# Patient Record
Sex: Male | Born: 1945 | Race: White | Hispanic: No | Marital: Married | Smoking: Former smoker
Health system: Southern US, Community
[De-identification: ages and names within clinical notes are randomized; demographics above are authoritative.]

## PROBLEM LIST (undated history)

## (undated) DIAGNOSIS — G4733 Obstructive sleep apnea (adult) (pediatric): Secondary | ICD-10-CM

## (undated) DIAGNOSIS — I495 Sick sinus syndrome: Secondary | ICD-10-CM

## (undated) DIAGNOSIS — I509 Heart failure, unspecified: Secondary | ICD-10-CM

## (undated) DIAGNOSIS — Z9289 Personal history of other medical treatment: Secondary | ICD-10-CM

## (undated) DIAGNOSIS — Z950 Presence of cardiac pacemaker: Secondary | ICD-10-CM

## (undated) DIAGNOSIS — I499 Cardiac arrhythmia, unspecified: Secondary | ICD-10-CM

## (undated) DIAGNOSIS — Z95810 Presence of automatic (implantable) cardiac defibrillator: Secondary | ICD-10-CM

## (undated) DIAGNOSIS — E785 Hyperlipidemia, unspecified: Secondary | ICD-10-CM

## (undated) DIAGNOSIS — I4891 Unspecified atrial fibrillation: Secondary | ICD-10-CM

## (undated) DIAGNOSIS — M25569 Pain in unspecified knee: Secondary | ICD-10-CM

## (undated) DIAGNOSIS — I2510 Atherosclerotic heart disease of native coronary artery without angina pectoris: Secondary | ICD-10-CM

## (undated) DIAGNOSIS — M549 Dorsalgia, unspecified: Secondary | ICD-10-CM

## (undated) DIAGNOSIS — M5126 Other intervertebral disc displacement, lumbar region: Secondary | ICD-10-CM

## (undated) DIAGNOSIS — I10 Essential (primary) hypertension: Secondary | ICD-10-CM

## (undated) DIAGNOSIS — N400 Enlarged prostate without lower urinary tract symptoms: Secondary | ICD-10-CM

## (undated) HISTORY — DX: Other intervertebral disc displacement, lumbar region: M51.26

## (undated) HISTORY — DX: Pain in unspecified knee: M25.569

## (undated) HISTORY — PX: COLONOSCOPY: SHX174

## (undated) HISTORY — DX: Sick sinus syndrome: I49.5

## (undated) HISTORY — DX: Benign prostatic hyperplasia without lower urinary tract symptoms: N40.0

## (undated) HISTORY — DX: Atherosclerotic heart disease of native coronary artery without angina pectoris: I25.10

## (undated) HISTORY — PX: PACEMAKER IMPLANT: EP1218

## (undated) HISTORY — PX: KNEE SURGERY: SHX244

## (undated) HISTORY — PX: NOSE SURGERY: SHX723

## (undated) HISTORY — DX: Obstructive sleep apnea (adult) (pediatric): G47.33

## (undated) HISTORY — PX: CARDIAC DEFIBRILLATOR PLACEMENT: SHX171

## (undated) HISTORY — DX: Cardiac arrhythmia, unspecified: I49.9

## (undated) HISTORY — DX: Essential (primary) hypertension: I10

## (undated) HISTORY — DX: Heart failure, unspecified: I50.9

## (undated) HISTORY — DX: Personal history of other medical treatment: Z92.89

## (undated) HISTORY — DX: Hyperlipidemia, unspecified: E78.5

## (undated) HISTORY — DX: Unspecified atrial fibrillation: I48.91

## (undated) HISTORY — DX: Dorsalgia, unspecified: M54.9

---

## 1979-08-13 HISTORY — PX: BACK SURGERY: SHX140

## 2006-09-29 ENCOUNTER — Ambulatory Visit: Payer: Self-pay | Admitting: Gastroenterology

## 2007-03-21 ENCOUNTER — Emergency Department (HOSPITAL_COMMUNITY): Admission: EM | Admit: 2007-03-21 | Discharge: 2007-03-21 | Payer: Self-pay | Admitting: Emergency Medicine

## 2007-08-13 HISTORY — PX: ATRIAL FIBRILLATION ABLATION: SHX5732

## 2008-03-21 ENCOUNTER — Encounter: Payer: Self-pay | Admitting: Emergency Medicine

## 2008-03-22 ENCOUNTER — Inpatient Hospital Stay (HOSPITAL_COMMUNITY): Admission: EM | Admit: 2008-03-22 | Discharge: 2008-03-23 | Payer: Self-pay | Admitting: Cardiovascular Disease

## 2008-10-19 ENCOUNTER — Ambulatory Visit (HOSPITAL_COMMUNITY): Admission: RE | Admit: 2008-10-19 | Discharge: 2008-10-20 | Payer: Self-pay | Admitting: Otolaryngology

## 2009-06-27 ENCOUNTER — Encounter: Admission: RE | Admit: 2009-06-27 | Discharge: 2009-06-27 | Payer: Self-pay | Admitting: Orthopedic Surgery

## 2010-09-03 ENCOUNTER — Encounter
Admission: RE | Admit: 2010-09-03 | Discharge: 2010-09-03 | Payer: Self-pay | Source: Home / Self Care | Attending: Otolaryngology | Admitting: Otolaryngology

## 2010-11-22 LAB — BASIC METABOLIC PANEL
Calcium: 9.7 mg/dL (ref 8.4–10.5)
GFR calc Af Amer: >60 mL/min (ref >60)
GFR calc non Af Amer: >60 mL/min (ref >60)
Sodium: 140 mEq/L (ref 135–145)

## 2010-11-22 LAB — CBC
Hemoglobin: 14.7 g/dL (ref 13.0–17.0)
RBC: 4.5 MIL/uL (ref 4.22–5.81)
WBC: 5.4 10*3/uL (ref 4.0–10.5)

## 2010-12-25 NOTE — Op Note (Signed)
NAME:  Edward [name], GOLSON NO.:  [id number]   MEDICAL RECORD NO.:  [id number]          PATIENT TYPE:  OIB   LOCATION:  3308                         FACILITY:  [facility]   PHYSICIAN:  Kinnie Scales. Annalee Genta, M.D.DATE OF BIRTH:  1946-01-11   DATE OF PROCEDURE:  10/19/2008  DATE OF DISCHARGE:                               OPERATIVE REPORT   PREOPERATIVE DIAGNOSES:  1. Deviated nasal septum with airway obstruction.  2. Inferior turbinate hypertrophy.  3. Obstructive sleep apnea.  4. Status post prior nasal septoplasty.   POSTOPERATIVE DIAGNOSES:  1. Deviated nasal septum with airway obstruction.  2. Inferior turbinate hypertrophy.  3. Obstructive sleep apnea.  4. Status post prior nasal septoplasty.   INDICATIONS FOR SURGERY:  1. Deviated nasal septum with airway obstruction.  2. Inferior turbinate hypertrophy.  3. Obstructive sleep apnea.  4. Status post prior nasal septoplasty.   SURGICAL PROCEDURES:  1. Revision of nasal septoplasty.  2. Bilateral inferior turbinate reduction.   ANESTHESIA:  General endotracheal.   SURGEON:  Kinnie Scales. Annalee Genta, MD   COMPLICATIONS:  None.   BLOOD LOSS:  Less than 50 mL.   The patient transferred from the operating room to recovery room in  stable condition.  There were no complications.   BRIEF HISTORY:  The patient is a 65 year old white male who is referred  for evaluation of symptomatic nasal airway obstruction.  The patient has  a history of moderately severe obstructive sleep apnea.  He is unable to  wear CPAP because of nasal congestion and obstruction.  He had undergone  prior nasal septoplasty decades ago and since that time has noted  increasing problems with nasal airway blockage.  Examination revealed  significant amount of intranasal scarring and a right septal deviation  with inferior turbinate hypertrophy.  Given the patient's history and  physical examination, I recommended to undertake revision of nasal  septoplasty and inferior turbinate reduction.  The patient has  significant cardiac history including cardiac arrhythmia and underwent  ablation procedures performed at [facility].  He  was cleared by his cardiologist prior to any surgical intervention.  The  risks, benefits, and possible complications of the above procedure were  discussed in detail.  The patient understood and concurred with our plan  for surgery.  We anticipated overnight observation in a Step-Down Unit  because of the patient's underlying sleep apnea.   PROCEDURE:  The patient was brought to the operating room on October 19, 2008 and placed in supine position on the operating table.  General  endotracheal anesthesia was established without difficulty.  When the  patient was adequately anesthetized, his nasal cavities were examined  and then injected with a total of 5 mL of 1% lidocaine with 1:100,000  solution of epinephrine which was injected in a submucosal fashion along  the nasal septum and along the inferior turbinates bilaterally.  The  patient's nose was then packed with Afrin-soaked cottonoid pledget,  which was left in place for approximately 10 minutes for allowing  vasoconstriction and hemostasis.  The patient was prepped and draped in  a sterile  fashion, positioned on the operating table, and surgical  procedure was begun.   A right anterior hemitransfixion incision was created and a  mucoperichondrial flap was elevated from anterior to posterior along the  right hand side.  The patient had significant intranasal scarring and a  large right inferior septal spur in the mid and posterior aspects of  nasal septum.  Cartilage and bone had been resected in his previous  operation and this was relatively midline.  The anterior one-third of  the nasal septum was then fully exposed and scar tissue was resected and  a small amount of the mid septal cartilage.  Osteotome was used to  mobilize  bony septal spur and the overlying mucosa was preserved.  The  bone spur was resected.  The anterior dorsal and columellar cartilage  was intact and a midline position was not implemented during the  surgical procedure.  The mucoperichondrial flaps were then  reapproximated with a 4-0 gut suture on a Keith needle in a horizontal  mattress fashion.  The anterior hemitransfixion incision was closed in  the same stitch.  At the conclusion of surgical procedure, bilateral  Doyle nasal septal splints were placed after the application of  Bactroban ointment and these were sutured into position with a 3-0  Ethilon suture.   Attention was then turned to the inferior turbinates where turbinate  reduction was undertaken, beginning with bipolar cautery set at 12  watts.  Two submucosal passes were made in each inferior turbinate.  When the turbinates had been adequately cauterized, they were  outfractured.  Small anterior incisions were created in each turbinate  and small amount of turbinate bone was resected preserving the overlying  mucosa.  This created a widely patent anterior nasal cavity without  obstruction.  Nasal cavity was irrigated and suctioned.  Orogastric tube  was passed.  Stomach contents were aspirated.  The patient was then  awakened from the anesthetic, extubated, and transferred from the  operating room to recovery room in stable condition.  There were no  complications and blood loss was less than 50 mL.           ______________________________  Kinnie Scales. Annalee Genta, M.D.     DLS/MEDQ  D:  95/62/1308  T:  10/19/2008  Job:  [id number]

## 2010-12-25 NOTE — H&P (Signed)
NAME:  Edward [name], MCENROE NO.:  [id number]   MEDICAL RECORD NO.:  [id number]          PATIENT TYPE:  EMS   LOCATION:  ED                           [facility]ACILITY[facility]  [facility]   PHYSICIAN:  Richard A. Alanda Amass, M.D.DATE OF BIRTH:  [birth year]   DATE OF ADMISSION:  03/21/2008  DATE OF DISCHARGE:                              HISTORY & PHYSICAL   CHIEF COMPLAINTS:  Passing out.   HISTORY OF THE PRESENT ILLNESS:  The patient is a 65 year old male who  has been followed by Dr. Alanda Amass.  He is a Dentist.  He was seen by Dr. Alanda Amass for a similar spell a year ago.  He had an echocardiogram and a stress Myoview according to the patient,  which were normal.  A monitor did document PAF.  He apparently has  intermittent PAF and the patient usually notices this at night.  He does  not have palpitations or dizziness, but he can feel when his pulse is  irregular and he feels like he has to urinate frequently at night when  he is in atrial fibrillation.  His medications have been adjusted  recently for hypertension.  He was on Norvasc and this was discontinued,  and he was put on half of a Maxzide.  He apparently had some edema with  Norvasc.  He takes his Avapro at 7:00 P.M. each day and as a diuretic in  the morning.  This evening he took his Avapro went for a walk.  About 2  miles into his usual 3-3.5-mile  walk he could feel that his heart rate  was irregular.  He then had a frank syncopal spell.  He did sustain a  laceration to his nose and his chin, and his knees.  He is now seen in  the emergency room.   When he initially presented emergency room he was in atrial fibrillation  with interventricular conduction delay and rapid ventricular response of  143.  He was given Cardizem in the emergency room and he is now in sinus  rhythm at 75.  The patient denies any chest pain.   PAST MEDICAL AND SURGICAL HISTORY:  The patient's past medical history  is  remarkable for treated hypertension.  He has dyslipidemia.  He has  been seen at [facility] for gastroesophageal reflux symptoms.  He has  had previous back surgery and arthroscopic knee surgery.   MEDICATIONS:  The patient's current medications are:  1. Lipitor 40 mg a day.  2. Potassium 10 mEq a day.  3. Avapro 300 mg a day.  4. Aciphex 20 mg a day.  5. Maxzide 1/2 every day.   ALLERGIES:  The patient has no known drug allergies.   SOCIAL HISTORY:  The patient is a nonsmoker.  He is married.  He has one  child.  As noted he is the [employer].   FAMILY HISTORY:  The patient is adopted.   REVIEW OF SYSTEMS:  The review of systems is essentially unremarkable,  except for as noted above.  He denies any prostate trouble.  He has  not  had recent fever or chills, or nausea and vomiting.  He has not had  chest pain or unusual dyspnea.  He has not had thyroid problems or  diabetes.   PHYSICAL EXAMINATION:  VITAL SIGNS:  On physical exam in the emergency  room his blood pressure 134/82, his pulse now is 75 and respirations are  16.  GENERAL APPEARANCE:  In general he is a well-developed and well-  nourished male in no acute distress.  HEENT:  Head is essentially normocephalic.  He does have a laceration on  his nose and an abrasion on his chin.  NECK:  The neck is without JVD or bruit.  Thyroid is not enlarged.  CHEST:  The chest is clear to auscultation and percussion.  HEART:  Cardiac exam reveals regular rate and rhythm without obvious  murmur, rub or gallop.  Normal S1 and S2.  ABDOMEN:  The abdomen is nontender.  No hepatosplenomegaly.  Bowel  sounds are present.  EXTREMITIES:  The extremities are without edema.  He has a 3+/4  posterior tibial pulses bilaterally.  NEUROLOGIC:  The neuro exam is grossly intact.  He is awake, alert,  oriented, cooperative, and moves all extremities without obvious  deficit.   LABORATORY DATA:  Labs; CT scan of his head and  neck is negative.  Chemistries show sodium 143, potassium 3.3, BUN 18 and creatinine 1.12.  White count 5.5, hemoglobin 14.1, hematocrit 41.8 and platelets 247,000.  His SGOT is slightly elevated at 45 and SGPT 37.   IMPRESSION:  1. Syncopal spell, possibly orthostatic or possibly paroxysmal atrial      fibrillation with conversion pauses.  2. History of paroxysmal atrial fibrillation.  3. History of treated hypertension.  4. Treated dyslipidemia.  5. Gastroesophageal reflux.  6. Previous normal echocardiogram and stress Myoview per the patient's      history.   PLAN:  1. The patient will be admitted to the CCU.  2. We will have to transfer him to [facility] when a bed is available in      case he needs a temporary pacemaker.  3. We will arrange for an external pacemaker to be outside the room.  4. We will give him extra potassium tonight.  5. For now we will hold his diuretic.  6. His initial enzymes are negative and we will get another set in the      morning.  The patient does state that he has an appointment Dr.      Sampson Goon at [facility] next month.      Edward [name], P.A.      Richard A. Alanda Amass, M.D.  Electronically Signed    LKK/MEDQ  D:  03/21/2008  T:  03/22/2008  Job:  [id number]

## 2010-12-25 NOTE — Discharge Summary (Signed)
NAME:  Edward [name], Edward [name] NO.:  [id number]   MEDICAL RECORD NO.:  [id number]          PATIENT TYPE:  INP   LOCATION:  2001                         FACILITY:  [facility]   PHYSICIAN:  Richard A. Alanda Amass, M.D.DATE OF BIRTH:  [birth year]   DATE OF ADMISSION:  03/22/2008  DATE OF DISCHARGE:  03/23/2008                               DISCHARGE SUMMARY   DISCHARGE DIAGNOSES:  1. Status post syncope.  2. Atrial fibrillation/flutter with rapid ventricular response on      admission.  3. Known history of paroxysmal atrial fibrillation/flutter with 2:1      conduction delay.  4. Hypertension.  5. Hyperlipidemia.  6. Adult-onset diabetes mellitus.  7. Gastroesophageal reflux disease.   This is a 65 year old gentleman who is followed up by Dr. Alanda Amass in  our office for paroxysmal atrial fibrillation and the patient was  wearing CardioNet to identify the underlying arrhythmia.  The recording  revealed AFib/flutter with ventricular conduction delay and rate was as  high as 180.  The patient was scheduled to see Dr. Sampson Goon at [facility] to address this arrhythmia and he has an upcoming  appointment on April 12, 2008.  He has negative history of nuclear  stress test and echocardiogram and intermittently experiences this  bursts of rapid AFib.  The patient went for a walk and while walking  felt some strange sensation in the heart and deliberately fell having  syncopal spell.  During the fall, he injured the face and was brought to  the hospital with multiple facial abrasions.  On admission, telemetry  revealed AFib with rapid ventricular response and the patient was  started on Cardizem drip.  After the rate was controlled and the patient  converted to normal sinus rhythm, he was switched to p.o. Cardizem 30 mg  q.6 h.  We kept the patient in the hospital for observation 24 hours and  he did not have any recurrent arrhythmia.  He ambulated around the ward  without difficulties.  There were no recurrent syncopal spells.  On  March 23, 2008, the patient was seen by Dr. Allyson Sabal who considered him to  be stable for discharge home.   HOSPITAL LABORATORIES:  BMET showed sodium 138, potassium 3.5, chloride  104, CO2 26, BUN 11, creatinine 0.9, and glucose 117.  White blood cell  count 7.8, hemoglobin 14.3, hematocrit 41.9, and platelet count 255.   DISCHARGE MEDICATIONS:  The patient was discharged home on the following  meds.  1. Aciphex 20 mg daily.  2. Avapro 300 mg daily.  3. Potassium chloride 10 mEq daily.  4. Lipitor 20 mg daily.  5. Triamterene/hydrochlorothiazide 75/50 daily.  6. Cardizem 120 mg daily.  7. Neosporin facial ointment which he was instructed to apply twice a      day to facial abrasions.   DISCHARGE FOLLOWUP:  The patient was instructed to keep his appointment  at [facility] on April 12, 2008 with Dr. Sampson Goon and  then he will be seen by Dr. Alanda Amass after that on April 15, 2008 at  2:30 p.m. in our office.  Raymon Mutton, P.A.      Richard A. Alanda Amass, M.D.  Electronically Signed    MK/MEDQ  D:  03/23/2008  T:  03/24/2008  Job:  [id number]   cc:   [facility]  [name]. Dagoberto Ligas, M.D.

## 2010-12-28 NOTE — Assessment & Plan Note (Signed)
[name]                         GASTROENTEROLOGY OFFICE NOTE   Edward [name], Edward [name]                     MRN:          [id number]  DATE:09/29/2006                            DOB:          18-Oct-1945    He returns for his yearly followup for his acid reflux. He takes AcipHex  20 mg a day and is asymptomatic. He has no dysphagia, chest pain or  extra esophageal manifestations of GERD. His bowels are moving well and  he is up-to-date on his colonoscopy with his last exam February 2002.   He is followed every 6 months by Dr. Dagoberto Ligas for hypertension and  hypercholesterolemia and is on Lipitor 20 mg a day, Avapro 300 mg a day,  triamterene-HCTZ 75-50 mg a day. He says he does yearly Hemoccult cards  which have been negative. There is no family history of colon carcinoma  but he is adopted.   He weighs 280 pounds, blood pressure 150/90, pulse was 72 and regular. A  general physical examination was not performed.   ASSESSMENT:  Edward [name] has chronic gastroesophageal reflux disease  which is managed well with daily proton pump inhibitor therapy. His last  endoscopy was in June of 1998.   RECOMMENDATIONS:  1. Colonoscopy at 10 year intervals.  2. Renew AcipHex and reflux regimen and review with patient.  3. Continue medication and follow up with Dr. Dagoberto Ligas as previously      planned.     [name], MD, Caleen Essex, FAGA  Electronically Signed    DRP/MedQ  DD: 09/29/2006  DT: 09/29/2006  Job #: [id number]   cc:   [name]. Dagoberto Ligas, M.D.

## 2011-05-10 LAB — DIFFERENTIAL
Eosinophils Absolute: 0.1
Lymphs Abs: 1.2
Neutrophils Relative %: 65

## 2011-05-10 LAB — CBC
HCT: 41.8
Hemoglobin: 14.1
MCHC: 33.6
MCHC: 34.1
MCV: 92.5
MCV: 92.9
RBC: 4.51
RDW: 13.2

## 2011-05-10 LAB — COMPREHENSIVE METABOLIC PANEL
ALT: 37
CO2: 25
Calcium: 9.7
Creatinine, Ser: 1.12
GFR calc non Af Amer: >60
Glucose, Bld: 116 — ABNORMAL HIGH
Sodium: 143

## 2011-05-10 LAB — MAGNESIUM: Magnesium: 2.3

## 2011-05-10 LAB — BASIC METABOLIC PANEL
CO2: 26
CO2: 29
Calcium: 9.2
Chloride: 105
Creatinine, Ser: 0.9
GFR calc Af Amer: >60
Glucose, Bld: 117 — ABNORMAL HIGH
Potassium: 3.9
Sodium: 142

## 2011-05-10 LAB — T4, FREE: Free T4: 1.14

## 2011-05-10 LAB — PROTIME-INR
INR: 1.1
Prothrombin Time: 14.4

## 2011-05-10 LAB — CK TOTAL AND CKMB (NOT AT ARMC)
CK, MB: 3.7
Relative Index: 1.2

## 2011-05-10 LAB — TSH: TSH: 2.117

## 2011-05-10 LAB — APTT: aPTT: 29

## 2011-05-27 LAB — CBC
MCHC: 34.6
Platelets: 272
RBC: 4.18 — ABNORMAL LOW
WBC: 9.5

## 2011-05-27 LAB — POCT CARDIAC MARKERS
Operator id: 285491
Troponin i, poc: <0.05

## 2011-05-27 LAB — DIFFERENTIAL
Eosinophils Absolute: 0
Eosinophils Relative: 0
Lymphocytes Relative: 9 — ABNORMAL LOW
Lymphs Abs: 0.8
Monocytes Absolute: 0.7

## 2011-05-27 LAB — URINALYSIS, ROUTINE W REFLEX MICROSCOPIC
Glucose, UA: NEGATIVE
Ketones, ur: NEGATIVE
Nitrite: NEGATIVE
Specific Gravity, Urine: 1.014
pH: 7

## 2011-05-27 LAB — COMPREHENSIVE METABOLIC PANEL
ALT: 32
AST: 28
Albumin: 4
CO2: 30
Calcium: 9.3
Chloride: 106
GFR calc Af Amer: >60
GFR calc non Af Amer: 56 — ABNORMAL LOW
Sodium: 140

## 2011-10-09 ENCOUNTER — Encounter: Payer: Self-pay | Admitting: Gastroenterology

## 2011-10-22 ENCOUNTER — Ambulatory Visit (AMBULATORY_SURGERY_CENTER): Payer: 59

## 2011-10-22 ENCOUNTER — Encounter: Payer: Self-pay | Admitting: Gastroenterology

## 2011-10-22 VITALS — Ht 75.0 in | Wt 273.4 lb

## 2011-10-22 DIAGNOSIS — Z1211 Encounter for screening for malignant neoplasm of colon: Secondary | ICD-10-CM

## 2011-10-22 MED ORDER — PEG-KCL-NACL-NASULF-NA ASC-C 100 G PO SOLR: 1.0000 | Freq: Once | ORAL | Status: AC

## 2011-11-04 ENCOUNTER — Encounter: Payer: Self-pay | Admitting: Gastroenterology

## 2011-11-04 ENCOUNTER — Ambulatory Visit (AMBULATORY_SURGERY_CENTER): Payer: 59 | Admitting: Gastroenterology

## 2011-11-04 VITALS — BP 161/90 | HR 63 | Temp 96.8°F | Resp 13

## 2011-11-04 DIAGNOSIS — Z1211 Encounter for screening for malignant neoplasm of colon: Secondary | ICD-10-CM

## 2011-11-04 MED ORDER — SODIUM CHLORIDE 0.9 % IV SOLN: 500.0000 mL | INTRAVENOUS | Status: AC

## 2011-11-04 NOTE — Progress Notes (Signed)
Patient did not experience any of the following events: a burn prior to discharge; a fall within the facility; wrong site/side/patient/procedure/implant event; or a hospital transfer or hospital admission upon discharge from the facility. (G8907) Patient did not have preoperative order for IV antibiotic SSI prophylaxis. (G8918)  

## 2011-11-04 NOTE — Patient Instructions (Signed)
Discharge instructions given with verbal understanding. Normal exam. Resume previous medications. YOU HAD AN ENDOSCOPIC PROCEDURE TODAY AT THE  ENDOSCOPY CENTER: Refer to the procedure report that was given to you for any specific questions about what was found during the examination.  If the procedure report does not answer your questions, please call your gastroenterologist to clarify.  If you requested that your care partner not be given the details of your procedure findings, then the procedure report has been included in a sealed envelope for you to review at your convenience later.  YOU SHOULD EXPECT: Some feelings of bloating in the abdomen. Passage of more gas than usual.  Walking can help get rid of the air that was put into your GI tract during the procedure and reduce the bloating. If you had a lower endoscopy (such as a colonoscopy or flexible sigmoidoscopy) you may notice spotting of blood in your stool or on the toilet paper. If you underwent a bowel prep for your procedure, then you may not have a normal bowel movement for a few days.  DIET: Your first meal following the procedure should be a light meal and then it is ok to progress to your normal diet.  A half-sandwich or bowl of soup is an example of a good first meal.  Heavy or fried foods are harder to digest and may make you feel nauseous or bloated.  Likewise meals heavy in dairy and vegetables can cause extra gas to form and this can also increase the bloating.  Drink plenty of fluids but you should avoid alcoholic beverages for 24 hours.  ACTIVITY: Your care partner should take you home directly after the procedure.  You should plan to take it easy, moving slowly for the rest of the day.  You can resume normal activity the day after the procedure however you should NOT DRIVE or use heavy machinery for 24 hours (because of the sedation medicines used during the test).    SYMPTOMS TO REPORT IMMEDIATELY: A gastroenterologist  can be reached at any hour.  During normal business hours, 8:30 AM to 5:00 PM Monday through Friday, call [phone].  After hours and on weekends, please call the GI answering service at [phone] who will take a message and have the physician on call contact you.   Following lower endoscopy (colonoscopy or flexible sigmoidoscopy):  Excessive amounts of blood in the stool  Significant tenderness or worsening of abdominal pains  Swelling of the abdomen that is new, acute  Fever of 100F or higher  FOLLOW UP: If any biopsies were taken you will be contacted by phone or by letter within the next 1-3 weeks.  Call your gastroenterologist if you have not heard about the biopsies in 3 weeks.  Our staff will call the home number listed on your records the next business day following your procedure to check on you and address any questions or concerns that you may have at that time regarding the information given to you following your procedure. This is a courtesy call and so if there is no answer at the home number and we have not heard from you through the emergency physician on call, we will assume that you have returned to your regular daily activities without incident.  SIGNATURES/CONFIDENTIALITY: You and/or your care partner have signed paperwork which will be entered into your electronic medical record.  These signatures attest to the fact that that the information above on your After Visit Summary has been reviewed   and is understood.  Full responsibility of the confidentiality of this discharge information lies with you and/or your care-partner. 

## 2011-11-04 NOTE — Progress Notes (Signed)
Pt states that he had some heart fluttering a few years ago, which resulted in atrial fib, Dr. Sampson Goon at [facility] did an atrial ablation, and he has not had any trouble with atrial fibrillation since then. MD aware

## 2011-11-04 NOTE — Op Note (Signed)
[facility] [address] [place], [place]  [address]  COLONOSCOPY PROCEDURE REPORT  PATIENT:  [name], Edward  MR#:  [id number] BIRTHDATE:  May 05, 1946, 65 yrs. old  GENDER:  male ENDOSCOPIST:  [name], MD, [facility] REF. BY: PROCEDURE DATE:  11/04/2011 PROCEDURE:  Average-risk screening colonoscopy G0121 ASA CLASS:  Class II INDICATIONS:  Routine Risk Screening MEDICATIONS:   propofol (Diprivan) 180 mg IV  DESCRIPTION OF PROCEDURE:   After the risks and benefits and of the procedure were explained, informed consent was obtained. Digital rectal exam was performed and revealed no abnormalities. The [id number] endoscope was introduced through the anus and advanced to the cecum, which was identified by both the appendix and ileocecal valve.  The quality of the prep was excellent, using MoviPrep.  The instrument was then slowly withdrawn as the colon was fully examined. <<PROCEDUREIMAGES>>  FINDINGS:  No polyps or cancers were seen.  This was otherwise a normal examination of the colon.   Retroflexed views in the rectum revealed no abnormalities.    The scope was then withdrawn from the patient and the procedure completed.  COMPLICATIONS:  None ENDOSCOPIC IMPRESSION: 1) No polyps or cancers 2) Otherwise normal examination RECOMMENDATIONS: 1) Continue current colorectal screening recommendations for "routine risk" patients with a repeat colonoscopy in 10 years. 2) Continue current medications  REPEAT EXAM:  No  ______________________________ [name], MD, Clementeen Graham  CC:  n. eSIGNED:   Vania Rea. Zair Borawski at 11/04/2011 11:30 AM  Arby Barrette, [phone]

## 2011-11-05 ENCOUNTER — Telehealth: Payer: Self-pay | Admitting: *Deleted

## 2011-11-05 NOTE — Telephone Encounter (Signed)
  Follow up Call-  Call back number 11/04/2011  Post procedure Call Back phone  # [phone]  Permission to leave phone message Yes    No answer. Message left.

## 2011-11-11 DIAGNOSIS — Z9289 Personal history of other medical treatment: Secondary | ICD-10-CM

## 2011-11-11 HISTORY — DX: Personal history of other medical treatment: Z92.89

## 2012-04-12 HISTORY — PX: TRANSTHORACIC ECHOCARDIOGRAM: SHX275

## 2013-02-13 ENCOUNTER — Emergency Department (HOSPITAL_COMMUNITY): Payer: PRIVATE HEALTH INSURANCE

## 2013-02-13 ENCOUNTER — Emergency Department (HOSPITAL_COMMUNITY)
Admission: EM | Admit: 2013-02-13 | Discharge: 2013-02-13 | Disposition: A | Discharge status: Home / Self Care | Payer: PRIVATE HEALTH INSURANCE | Attending: Emergency Medicine | Admitting: Emergency Medicine

## 2013-02-13 ENCOUNTER — Encounter (HOSPITAL_COMMUNITY): Payer: Self-pay

## 2013-02-13 DIAGNOSIS — Y9389 Activity, other specified: Secondary | ICD-10-CM | POA: Insufficient documentation

## 2013-02-13 DIAGNOSIS — Z8739 Personal history of other diseases of the musculoskeletal system and connective tissue: Secondary | ICD-10-CM | POA: Insufficient documentation

## 2013-02-13 DIAGNOSIS — IMO0002 Reserved for concepts with insufficient information to code with codable children: Secondary | ICD-10-CM | POA: Insufficient documentation

## 2013-02-13 DIAGNOSIS — S9301XA Subluxation of right ankle joint, initial encounter: Secondary | ICD-10-CM

## 2013-02-13 DIAGNOSIS — I4891 Unspecified atrial fibrillation: Secondary | ICD-10-CM | POA: Insufficient documentation

## 2013-02-13 DIAGNOSIS — Y9289 Other specified places as the place of occurrence of the external cause: Secondary | ICD-10-CM | POA: Insufficient documentation

## 2013-02-13 DIAGNOSIS — Z7982 Long term (current) use of aspirin: Secondary | ICD-10-CM | POA: Insufficient documentation

## 2013-02-13 DIAGNOSIS — Z79899 Other long term (current) drug therapy: Secondary | ICD-10-CM | POA: Insufficient documentation

## 2013-02-13 DIAGNOSIS — S9306XA Dislocation of unspecified ankle joint, initial encounter: Secondary | ICD-10-CM | POA: Insufficient documentation

## 2013-02-13 DIAGNOSIS — S82899A Other fracture of unspecified lower leg, initial encounter for closed fracture: Secondary | ICD-10-CM | POA: Insufficient documentation

## 2013-02-13 DIAGNOSIS — Z87891 Personal history of nicotine dependence: Secondary | ICD-10-CM | POA: Insufficient documentation

## 2013-02-13 DIAGNOSIS — I10 Essential (primary) hypertension: Secondary | ICD-10-CM | POA: Insufficient documentation

## 2013-02-13 DIAGNOSIS — S82839A Other fracture of upper and lower end of unspecified fibula, initial encounter for closed fracture: Secondary | ICD-10-CM

## 2013-02-13 DIAGNOSIS — E785 Hyperlipidemia, unspecified: Secondary | ICD-10-CM | POA: Insufficient documentation

## 2013-02-13 MED ORDER — OXYCODONE-ACETAMINOPHEN 5-325 MG PO TABS: 1.0000 | ORAL_TABLET | Freq: Four times a day (QID) | ORAL | Status: DC | PRN
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
## 2013-02-13 NOTE — ED Notes (Signed)
Ortho Tech at bedside.  
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
## 2013-03-26 ENCOUNTER — Other Ambulatory Visit: Payer: Self-pay | Admitting: *Deleted

## 2013-03-26 MED ORDER — ATORVASTATIN CALCIUM 20 MG PO TABS: 20.0000 mg | ORAL_TABLET | Freq: Every day | ORAL | Status: DC

## 2013-03-26 NOTE — Telephone Encounter (Signed)
Rx was sent to pharmacy electronically via [employer] 

## 2013-06-01 ENCOUNTER — Telehealth: Payer: Self-pay | Admitting: Cardiovascular Disease

## 2013-06-01 NOTE — Telephone Encounter (Signed)
Message forwarded to J.C. Wildman, LPN.  

## 2013-06-01 NOTE — Telephone Encounter (Signed)
Please have J C to call him about his medicine.

## 2013-06-02 ENCOUNTER — Telehealth: Payer: Self-pay | Admitting: Cardiovascular Disease

## 2013-06-02 NOTE — Telephone Encounter (Signed)
Pt called message left

## 2013-06-02 NOTE — Telephone Encounter (Signed)
Returning Edward [name]'s call regarding med refill tekturna 150 mg   Please call

## 2013-06-02 NOTE — Telephone Encounter (Signed)
Called no answer left message  

## 2013-06-02 NOTE — Telephone Encounter (Signed)
Message forwarded to [facility]. Berlinda Last, LPN.  Chart # [id number] already on desk.

## 2013-06-04 ENCOUNTER — Other Ambulatory Visit: Payer: Self-pay | Admitting: Cardiovascular Disease

## 2013-06-04 MED ORDER — ALISKIREN FUMARATE 150 MG PO TABS: 150.0000 mg | ORAL_TABLET | Freq: Every day | ORAL | Status: DC

## 2013-06-04 NOTE — Telephone Encounter (Signed)
Returned call.  Left message to call back before 4pm.  RN attempted to refill and ?Interaction w/ Avapro and Tekturna.  Will notify pharmacist for advice.

## 2013-06-04 NOTE — Telephone Encounter (Signed)
Pt called back and verified x 2.  Pt informed refill sent w/ supply to last until appt on 11.7.14.  Also informed of concern for increased K+ w/ Tekturna and Avapro and Dr. Rennis Golden has been notified.  Pt verbalized understanding and agreed w/ plan.

## 2013-06-04 NOTE — Telephone Encounter (Signed)
Still has not gotten refill on Tekturna med . [facility] says has been sent to [place] and not heard back  He is totally out  Please call  (Refill is supposed to go to [facility])  Also said  He thinks JC has been trying to contact him

## 2013-06-04 NOTE — Telephone Encounter (Signed)
Per Belenda Cruise, PharmD, okay to refill enough to last until appt.  Notify Dr. Rennis Golden to review meds at appt.  Refill(s) sent to pharmacy [facility]).

## 2013-06-16 ENCOUNTER — Encounter: Payer: Self-pay | Admitting: *Deleted

## 2013-06-18 ENCOUNTER — Ambulatory Visit (INDEPENDENT_AMBULATORY_CARE_PROVIDER_SITE_OTHER): Payer: PRIVATE HEALTH INSURANCE | Admitting: Internal Medicine

## 2013-06-18 ENCOUNTER — Encounter: Payer: Self-pay | Admitting: Internal Medicine

## 2013-06-18 ENCOUNTER — Ambulatory Visit (HOSPITAL_COMMUNITY)
Admission: RE | Admit: 2013-06-18 | Discharge: 2013-06-18 | Disposition: A | Discharge status: Home / Self Care | Payer: PRIVATE HEALTH INSURANCE | Source: Ambulatory Visit | Attending: Internal Medicine | Admitting: Internal Medicine

## 2013-06-18 VITALS — BP 150/70 | HR 69 | Ht 75.5 in | Wt 260.8 lb

## 2013-06-18 DIAGNOSIS — M25473 Effusion, unspecified ankle: Secondary | ICD-10-CM

## 2013-06-18 DIAGNOSIS — M7989 Other specified soft tissue disorders: Secondary | ICD-10-CM

## 2013-06-18 DIAGNOSIS — E785 Hyperlipidemia, unspecified: Secondary | ICD-10-CM

## 2013-06-18 DIAGNOSIS — G4733 Obstructive sleep apnea (adult) (pediatric): Secondary | ICD-10-CM

## 2013-06-18 DIAGNOSIS — I447 Left bundle-branch block, unspecified: Secondary | ICD-10-CM

## 2013-06-18 DIAGNOSIS — I10 Essential (primary) hypertension: Secondary | ICD-10-CM

## 2013-06-18 DIAGNOSIS — I495 Sick sinus syndrome: Secondary | ICD-10-CM

## 2013-06-18 DIAGNOSIS — Z8679 Personal history of other diseases of the circulatory system: Secondary | ICD-10-CM

## 2013-06-18 DIAGNOSIS — R600 Localized edema: Secondary | ICD-10-CM

## 2013-06-18 DIAGNOSIS — R609 Edema, unspecified: Secondary | ICD-10-CM | POA: Insufficient documentation

## 2013-06-18 DIAGNOSIS — I480 Paroxysmal atrial fibrillation: Secondary | ICD-10-CM

## 2013-06-18 DIAGNOSIS — Z9889 Other specified postprocedural states: Secondary | ICD-10-CM

## 2013-06-18 DIAGNOSIS — I4891 Unspecified atrial fibrillation: Secondary | ICD-10-CM

## 2013-06-18 DIAGNOSIS — M25471 Effusion, right ankle: Secondary | ICD-10-CM

## 2013-06-18 MED ORDER — ALISKIREN FUMARATE 150 MG PO TABS: 150.0000 mg | ORAL_TABLET | Freq: Every day | ORAL | Status: DC

## 2013-06-18 NOTE — Progress Notes (Signed)
Right Lower Extremity Venous Duplex Completed. °Brianna L Mazza,RVT °

## 2013-06-18 NOTE — Patient Instructions (Addendum)
Your physician recommends that you return for lab work in a week or so. You will need to be fasting.   Your physician has requested that you have a lower venous duplex - right side - to rule out a DVT.   Your physician wants you to follow-up in: 6 months. You will receive a reminder letter in the mail two months in advance. If you don't receive a letter, please call our office to schedule the follow-up appointment.

## 2013-06-21 ENCOUNTER — Encounter: Payer: Self-pay | Admitting: Internal Medicine

## 2013-06-21 DIAGNOSIS — I495 Sick sinus syndrome: Secondary | ICD-10-CM | POA: Insufficient documentation

## 2013-06-21 DIAGNOSIS — I10 Essential (primary) hypertension: Secondary | ICD-10-CM | POA: Insufficient documentation

## 2013-06-21 DIAGNOSIS — I447 Left bundle-branch block, unspecified: Secondary | ICD-10-CM | POA: Insufficient documentation

## 2013-06-21 DIAGNOSIS — M25471 Effusion, right ankle: Secondary | ICD-10-CM | POA: Insufficient documentation

## 2013-06-21 DIAGNOSIS — Z8679 Personal history of other diseases of the circulatory system: Secondary | ICD-10-CM | POA: Insufficient documentation

## 2013-06-21 DIAGNOSIS — E785 Hyperlipidemia, unspecified: Secondary | ICD-10-CM | POA: Insufficient documentation

## 2013-06-21 DIAGNOSIS — G4733 Obstructive sleep apnea (adult) (pediatric): Secondary | ICD-10-CM | POA: Insufficient documentation

## 2013-06-21 DIAGNOSIS — I480 Paroxysmal atrial fibrillation: Secondary | ICD-10-CM | POA: Insufficient documentation

## 2013-06-21 NOTE — Progress Notes (Signed)
OFFICE NOTE  Chief Complaint:  Follow-up, right ankle/calf swelling  Primary Care Physician: No PCP Per Patient  HPI:  Edward [name] is a pleasant 67 year old retired [employer] [address] who has a history of sick sinus syndrome and paroxysmal atrial fibrillation in the past. He underwent complex ablation by Dr. Sampson [name] at [facility] in 2009. Subsequently he had a isthmus ablation for atrial flutter. He also has obstructive sleep apnea, BPH, hypertension, dyslipidemia and a former smoking history. Recently he suffered a fracture of the right ankle and has had subsequent swelling and pain as well as warmth of the right ankle and calf. He also has a known left bundle branch block and he underwent stress testing in 2013 which was negative for ischemia. He hasn't had an echocardiogram at that time which showed an EF greater than 55% with moderately dilated atria and mild aortic regurgitation.  PMHx:  Past Medical History  Diagnosis Date  . Hypertension   . Hyperlipidemia   . Back pain   . Knee pain     bilat arthritis  . Atrial fibrillation     PAF; ablation in 2009  . Ruptured lumbar disc   . SSS (sick sinus syndrome)   . OSA (obstructive sleep apnea)     CPAP; sleep study 06/2007 - AHI during total sleep 25.95/hr and during REM 32.57/hr (moderate sleep apnea); postural component w/severe sleep apnea during supine (AHI 76.36/hr)  . BPH (benign prostatic hyperplasia)   . History of nuclear stress test 11/2011    lexiscan; mild ischemia in apical inferior & apical lateral regions    Past Surgical History  Procedure Laterality Date  . Back surgery  1981  . Knee surgery Bilateral 1961, 1989, 1991    3 surg on right/ 1 surg on left  . Colonoscopy    . Nose surgery      couple surg  . Atrial fibrillation ablation  2009    [facility] - isthmius tricuspid abaltion w/no evidence of ABNRT  . Transthoracic echocardiogram  04/2012    EF=>55%; mod conc LVH; LA mod dilated; mild  MR; trace TR; AV mildly sclerotic & mild regurg; aortic root sclerosis/calcif    FAMHx:  Family History  Problem Relation Age of Onset  . Adopted: Yes    SOCHx:   reports that he quit smoking about 45 years ago. His smoking use included Pipe. He has never used smokeless tobacco. He reports that he drinks alcohol. He reports that he does not use illicit drugs.  ALLERGIES:  Allergies  Allergen Reactions  . Beta Adrenergic Blockers   . Betapace [Sotalol Hcl]     ROS: A comprehensive review of systems was negative except for: Constitutional: positive for fatigue Cardiovascular: positive for lower extremity edema Musculoskeletal: positive for arthralgias  HOME MEDS: Current Outpatient Prescriptions  Medication Sig Dispense Refill  . aliskiren (TEKTURNA) 150 MG tablet Take 1 tablet (150 mg total) by mouth daily.  30 tablet  11  . aspirin 325 MG tablet Take 325 mg by mouth daily.      Marland Kitchen atorvastatin (LIPITOR) 40 MG tablet Take 20 mg by mouth daily.      Marland Kitchen CIALIS 5 MG tablet Take 1 tablet by mouth daily.      Marland Kitchen co-enzyme Q-10 30 MG capsule Take 30 mg by mouth daily.      . fish oil-omega-3 fatty acids 1000 MG capsule Take 2 g by mouth daily.      . Ibuprofen (ADVIL  PO) Take by mouth as needed.      . irbesartan (AVAPRO) 300 MG tablet Take 300 mg by mouth at bedtime.      . Multiple Vitamin (MULTIVITAMIN) tablet Take 1 tablet by mouth daily.      . ranitidine (ZANTAC) 150 MG tablet Take 150 mg by mouth every morning.       . triamterene-hydrochlorothiazide (MAXZIDE-25) 37.5-25 MG per tablet Take 1 tablet by mouth daily.       Current Facility-Administered Medications  Medication Dose Route Frequency Provider Last Rate Last Dose  . 0.9 %  sodium chloride infusion  500 mL Intravenous Continuous Edward Layman, MD        LABS/IMAGING: No results found for this or any previous visit (from the past 48 hour(s)). No results found.  VITALS: BP 150/70  Pulse 69  Ht 6' 3.5" (1.918  m)  Wt 260 lb 12.8 oz (118.298 kg)  BMI 32.16 kg/m2  EXAM: General appearance: alert and no distress Neck: no carotid bruit and no JVD Lungs: clear to auscultation bilaterally Heart: regular rate and rhythm, S1, S2 normal, no murmur, click, rub or gallop Abdomen: soft, non-tender; bowel sounds normal; no masses,  no organomegaly Extremities: right ankle is swollen (2+ edema), mildly erythemaous and some mild calf tenderness, left foot with trace edema Pulses: 2+ and symmetric Skin: erythema and warmth of RL# Neurologic: Grossly normal Psych: Mildly anxious  EKG: Normal sinus rhythm at 69, left bundle branch block  ASSESSMENT: 1. Left bundle branch block 2. History of sick sinus syndrome 3. Paroxysmal atrial fibrillation status post AF-ablation and a-flutter ablation 4. Right ankle trauma with subsequent swelling, erythema and pain 5. HTN 6. Dyslipidemia  PLAN: 1.   Edward [name] is doing fairly well with regard to his blood pressure and cholesterol. He is a stable left bundle branch block denies any chest pain. He is in sinus rhythm today. There is swelling, erythema and warmth of the right lower sternum a much greater than the left which is concerning for possible DVT. I ordered a stat venous ultrasound which was performed in the office today and was negative for DVT. I suspect there may be either venous insufficiency or neuropathy leading to his swelling. I've recommended a compression stocking, elevation and possibly adding diuretics. Plan to see him back in 6 months. We will obtain laboratory work including a CMP and lipid profile.  Chrystie Nose, MD, [facility] Attending Cardiologist [facility]  [name],[name] 06/21/2013, 6:28 PM

## 2013-06-22 LAB — NMR LIPOPROFILE WITH LIPIDS
Cholesterol, Total: 160 mg/dL (ref <200)
HDL Particle Number: 25.6 umol/L — ABNORMAL LOW (ref >=30.5)
LDL (calc): 100 mg/dL — ABNORMAL HIGH (ref <100)
LP-IR Score: 53 — ABNORMAL HIGH (ref <=45)
Small LDL Particle Number: 1264 nmol/L — ABNORMAL HIGH (ref <=527)
Triglycerides: 120 mg/dL (ref <150)
VLDL Size: 44.8 nm (ref <=46.6)

## 2013-06-25 ENCOUNTER — Telehealth: Payer: Self-pay | Admitting: *Deleted

## 2013-06-25 ENCOUNTER — Encounter: Payer: Self-pay | Admitting: *Deleted

## 2013-06-25 MED ORDER — ATORVASTATIN CALCIUM 40 MG PO TABS: 40.0000 mg | ORAL_TABLET | Freq: Every day | ORAL | Status: DC

## 2013-06-25 NOTE — Telephone Encounter (Signed)
Message copied by Lindell Spar on Fri Jun 25, 2013 12:19 PM ------      Message from: Chrystie Nose      Created: Fri Jun 25, 2013 11:34 AM       Cholesterol is too high. Please have him increase lipitor to 40 mg daily.            Dr. Rennis Golden ------

## 2013-06-25 NOTE — Telephone Encounter (Signed)
Left VM with lab results and need to increase lipitor to 40mg  daily. Rx was sent to pharmacy electronically.

## 2013-12-06 ENCOUNTER — Other Ambulatory Visit: Payer: Self-pay | Admitting: *Deleted

## 2013-12-06 ENCOUNTER — Other Ambulatory Visit: Payer: Self-pay

## 2013-12-06 MED ORDER — IRBESARTAN 300 MG PO TABS: 300.0000 mg | ORAL_TABLET | Freq: Every day | ORAL | Status: DC

## 2013-12-06 MED ORDER — IRBESARTAN 300 MG PO TABS
300.0000 mg | ORAL_TABLET | Freq: Every day | ORAL | Status: DC
Start: 2013-12-06 — End: 2014-08-17

## 2013-12-06 NOTE — Telephone Encounter (Signed)
Refilled Irbesartan 300 mg to [facility]  X [facility] via escribe.

## 2013-12-06 NOTE — Telephone Encounter (Signed)
Rx was sent to pharmacy electronically. 

## 2013-12-17 ENCOUNTER — Other Ambulatory Visit: Payer: Self-pay

## 2013-12-17 MED ORDER — TRIAMTERENE-HCTZ 37.5-25 MG PO TABS: 1.0000 | ORAL_TABLET | Freq: Every day | ORAL | Status: DC

## 2013-12-17 NOTE — Telephone Encounter (Signed)
Rx was sent to pharmacy electronically. 

## 2013-12-20 ENCOUNTER — Other Ambulatory Visit: Payer: Self-pay

## 2013-12-20 MED ORDER — TRIAMTERENE-HCTZ 37.5-25 MG PO TABS: 1.0000 | ORAL_TABLET | Freq: Every day | ORAL | Status: DC

## 2013-12-20 NOTE — Telephone Encounter (Signed)
Rx was sent to pharmacy electronically. 

## 2014-01-05 ENCOUNTER — Ambulatory Visit (INDEPENDENT_AMBULATORY_CARE_PROVIDER_SITE_OTHER): Payer: PRIVATE HEALTH INSURANCE | Admitting: Internal Medicine

## 2014-01-05 ENCOUNTER — Encounter: Payer: Self-pay | Admitting: Internal Medicine

## 2014-01-05 VITALS — BP 150/98 | HR 74 | Ht 75.5 in | Wt 260.8 lb

## 2014-01-05 DIAGNOSIS — Z9989 Dependence on other enabling machines and devices: Secondary | ICD-10-CM

## 2014-01-05 DIAGNOSIS — I4891 Unspecified atrial fibrillation: Secondary | ICD-10-CM

## 2014-01-05 DIAGNOSIS — I480 Paroxysmal atrial fibrillation: Secondary | ICD-10-CM

## 2014-01-05 DIAGNOSIS — M25476 Effusion, unspecified foot: Secondary | ICD-10-CM

## 2014-01-05 DIAGNOSIS — Z8679 Personal history of other diseases of the circulatory system: Secondary | ICD-10-CM

## 2014-01-05 DIAGNOSIS — M25473 Effusion, unspecified ankle: Secondary | ICD-10-CM

## 2014-01-05 DIAGNOSIS — Z9889 Other specified postprocedural states: Secondary | ICD-10-CM

## 2014-01-05 DIAGNOSIS — G4733 Obstructive sleep apnea (adult) (pediatric): Secondary | ICD-10-CM

## 2014-01-05 DIAGNOSIS — M25471 Effusion, right ankle: Secondary | ICD-10-CM

## 2014-01-05 DIAGNOSIS — I10 Essential (primary) hypertension: Secondary | ICD-10-CM

## 2014-01-05 DIAGNOSIS — E785 Hyperlipidemia, unspecified: Secondary | ICD-10-CM

## 2014-01-05 DIAGNOSIS — I495 Sick sinus syndrome: Secondary | ICD-10-CM

## 2014-01-05 DIAGNOSIS — I447 Left bundle-branch block, unspecified: Secondary | ICD-10-CM

## 2014-01-05 MED ORDER — ROSUVASTATIN CALCIUM 20 MG PO TABS: 20.0000 mg | ORAL_TABLET | Freq: Every day | ORAL | Status: DC

## 2014-01-05 NOTE — Patient Instructions (Signed)
Your physician has recommended you make the following change in your medication: STOP atorvastatin. START crestor 20 mg once daily.   Your physician recommends that you return for lab work in: 3 months (fasting)  Your physician wants you to follow-up in: 6 months. You will receive a reminder letter in the mail two months in advance. If you don't receive a letter, please call our office to schedule the follow-up appointment.

## 2014-01-05 NOTE — Progress Notes (Signed)
OFFICE NOTE  Chief Complaint:  Follow-up, right ankle/calf swelling  Primary Care Physician: No PCP Per Patient  HPI:  Edward [name] is a pleasant 68 year old retired [employer] who has a history of sick sinus syndrome and paroxysmal atrial fibrillation in the past. He underwent complex ablation by Dr. Ola Spurr at [facility] in 2009. Subsequently he had a isthmus ablation for atrial flutter. He also has obstructive sleep apnea, BPH, hypertension, dyslipidemia and a former smoking history. Recently he suffered a fracture of the right ankle and has had subsequent swelling and pain as well as warmth of the right ankle and calf. He also has a known left bundle branch block and he underwent stress testing in 2013 which was negative for ischemia. He hasn't had an echocardiogram at that time which showed an EF greater than 55% with moderately dilated atria and mild aortic regurgitation.  At his last office visit he had some lower extremity swelling with warmth and signs of congestion. I ordered an ultrasound in the office to rule out DVT which was negative. He still reports intermittent swelling and has had some improvement with wearing stockings but is not wearing them currently due to the hot weather.  PMHx:  Past Medical History  Diagnosis Date  . Hypertension   . Hyperlipidemia   . Back pain   . Knee pain     bilat arthritis  . Atrial fibrillation     PAF; ablation in 2009  . Ruptured lumbar disc   . SSS (sick sinus syndrome)   . OSA (obstructive sleep apnea)     CPAP; sleep study 06/2007 - AHI during total sleep 25.95/hr and during REM 32.57/hr (moderate sleep apnea); postural component w/severe sleep apnea during supine (AHI 76.36/hr)  . BPH (benign prostatic hyperplasia)   . History of nuclear stress test 11/2011    lexiscan; mild ischemia in apical inferior & apical lateral regions    Past Surgical History  Procedure Laterality Date  . Back surgery  1981   . Knee surgery Bilateral 1961, 1989, 1991    3 surg on right/ 1 surg on left  . Colonoscopy    . Nose surgery      couple surg  . Atrial fibrillation ablation  2009    [facility] - isthmius tricuspid abaltion w/no evidence of ABNRT  . Transthoracic echocardiogram  04/2012    EF=>55%; mod conc LVH; LA mod dilated; mild MR; trace TR; AV mildly sclerotic & mild regurg; aortic root sclerosis/calcif    FAMHx:  Family History  Problem Relation Age of Onset  . Adopted: Yes    SOCHx:   reports that he quit smoking about 45 years ago. His smoking use included Pipe. He has never used smokeless tobacco. He reports that he drinks alcohol. He reports that he does not use illicit drugs.  ALLERGIES:  Allergies  Allergen Reactions  . Beta Adrenergic Blockers   . Betapace [Sotalol Hcl]     ROS: A comprehensive review of systems was negative except for: Constitutional: positive for fatigue Cardiovascular: positive for lower extremity edema Musculoskeletal: positive for arthralgias  HOME MEDS: Current Outpatient Prescriptions  Medication Sig Dispense Refill  . aliskiren (TEKTURNA) 150 MG tablet Take 1 tablet (150 mg total) by mouth daily.  30 tablet  11  . aspirin 325 MG tablet Take 325 mg by mouth daily.      . Coenzyme Q10 (CO Q 10) 100 MG CAPS Take 200 mg by mouth daily.      Marland Kitchen  fish oil-omega-3 fatty acids 1000 MG capsule Take 2 g by mouth daily.      . Ibuprofen (ADVIL PO) Take by mouth as needed.      . irbesartan (AVAPRO) 300 MG tablet Take 1 tablet (300 mg total) by mouth at bedtime.  30 tablet  7  . Magnesium 400 MG CAPS Take 1 capsule by mouth daily.      . Multiple Vitamin (MULTIVITAMIN) tablet Take 1 tablet by mouth daily.      . ranitidine (ZANTAC) 150 MG tablet Take 150 mg by mouth every morning.       . triamterene-hydrochlorothiazide (MAXZIDE-25) 37.5-25 MG per tablet Take 1 tablet by mouth daily.  30 tablet  6  . rosuvastatin (CRESTOR) 20 MG tablet Take 1 tablet (20 mg total) by  mouth daily.  30 tablet  3   Current Facility-Administered Medications  Medication Dose Route Frequency Provider Last Rate Last Dose  . 0.9 %  sodium chloride infusion  500 mL Intravenous Continuous Sable Feil, MD        LABS/IMAGING: No results found for this or any previous visit (from the past 48 hour(s)). No results found.  VITALS: BP 150/98  Pulse 74  Ht 6' 3.5" (1.918 m)  Wt 260 lb 12.8 oz (118.298 kg)  BMI 32.16 kg/m2  EXAM: General appearance: alert and no distress Neck: no carotid bruit and no JVD Lungs: clear to auscultation bilaterally Heart: regular rate and rhythm, S1, S2 normal, no murmur, click, rub or gallop Abdomen: soft, non-tender; bowel sounds normal; no masses,  no organomegaly Extremities: no significant edema Pulses: 2+ and symmetric Skin: no skin discoloration Neurologic: Grossly normal Psych: Mildly anxious  EKG: Normal sinus rhythm at 74, left bundle branch block  ASSESSMENT: 1. Left bundle branch block 2. History of sick sinus syndrome 3. Paroxysmal atrial fibrillation status post AF-ablation and a-flutter ablation 4. Right ankle trauma with subsequent swelling, erythema and pain 5. HTN 6. Dyslipidemia  PLAN: 1.   Mr. Chalk is doing fairly well with regard to his blood pressure and cholesterol. He is a stable left bundle branch block denies any chest pain. He is in sinus rhythm today. His leg swelling comes and goes and has generally resolved. His only other complaint today is that he occasionally has some memory problems which she attributes to atorvastatin. He reported while stopping the medicine for a few days his symptoms improved. He has not tried other statin medications but does need a potent statin meds medicine, such as Crestor or high dose simvastatin. Unfortunately, per FDA guidelines I cannot start high-dose simvastatin therefore I would recommend Crestor is the only other viable alternative. I would recommend starting 20 mg  daily and would recheck a cholesterol profile in 3 months. He is to contact me if he continues to have problems with memory on this statin medication.  Followup will be in 6 months.  Pixie Casino, MD, [facility] Attending Cardiologist [place] 01/05/2014, 1:46 PM

## 2014-04-07 LAB — NMR LIPOPROFILE WITH LIPIDS
CHOLESTEROL, TOTAL: 112 mg/dL (ref <200)
HDL PARTICLE NUMBER: 29.4 umol/L — AB (ref >=30.5)
HDL Size: 8.3 nm — ABNORMAL LOW (ref >=9.2)
HDL-C: 36 mg/dL — ABNORMAL LOW (ref >=40)
LARGE VLDL-P: 1.3 nmol/L (ref <=2.7)
LDL (calc): 52 mg/dL (ref <100)
LDL Particle Number: 782 nmol/L (ref <1000)
LDL Size: 19.9 nm — ABNORMAL LOW (ref >20.5)
LP-IR Score: 53 — ABNORMAL HIGH (ref <=45)
Large HDL-P: <1.3 umol/L — ABNORMAL LOW (ref >=4.8)
SMALL LDL PARTICLE NUMBER: 665 nmol/L — AB (ref <=527)
Triglycerides: 121 mg/dL (ref <150)
VLDL Size: 43.8 nm (ref <=46.6)

## 2014-04-11 ENCOUNTER — Encounter: Payer: Self-pay | Admitting: *Deleted

## 2014-04-12 ENCOUNTER — Encounter: Payer: Self-pay | Admitting: Gastroenterology

## 2014-05-27 ENCOUNTER — Other Ambulatory Visit: Payer: Self-pay

## 2014-06-13 ENCOUNTER — Other Ambulatory Visit: Payer: Self-pay | Admitting: *Deleted

## 2014-06-13 MED ORDER — ALISKIREN FUMARATE 150 MG PO TABS: 150.0000 mg | ORAL_TABLET | Freq: Every day | ORAL | Status: DC

## 2014-07-15 ENCOUNTER — Other Ambulatory Visit: Payer: Self-pay | Admitting: Internal Medicine

## 2014-07-15 NOTE — Telephone Encounter (Signed)
Rx was sent to pharmacy electronically. 

## 2014-08-04 ENCOUNTER — Other Ambulatory Visit: Payer: Self-pay | Admitting: Internal Medicine

## 2014-08-08 NOTE — Telephone Encounter (Signed)
Rx(s) sent to pharmacy electronically.  

## 2014-08-17 ENCOUNTER — Other Ambulatory Visit: Payer: Self-pay | Admitting: Internal Medicine

## 2014-08-17 NOTE — Telephone Encounter (Signed)
Rx(s) sent to pharmacy electronically.  

## 2014-11-21 ENCOUNTER — Other Ambulatory Visit: Payer: Self-pay | Admitting: *Deleted

## 2014-11-21 MED ORDER — ROSUVASTATIN CALCIUM 20 MG PO TABS: 20.0000 mg | ORAL_TABLET | Freq: Every day | ORAL | Status: DC

## 2015-01-03 ENCOUNTER — Other Ambulatory Visit: Payer: Self-pay | Admitting: Internal Medicine

## 2015-01-03 NOTE — Telephone Encounter (Signed)
Rx(s) sent to pharmacy electronically.  

## 2015-01-05 ENCOUNTER — Other Ambulatory Visit: Payer: Self-pay | Admitting: Internal Medicine

## 2015-01-05 NOTE — Telephone Encounter (Signed)
Rx(s) sent to pharmacy electronically.  

## 2015-01-18 ENCOUNTER — Other Ambulatory Visit: Payer: Self-pay | Admitting: Internal Medicine

## 2015-01-18 NOTE — Telephone Encounter (Signed)
Rx(s) sent to pharmacy electronically.  

## 2015-01-26 ENCOUNTER — Other Ambulatory Visit: Payer: Self-pay | Admitting: Internal Medicine

## 2015-01-26 NOTE — Telephone Encounter (Signed)
Rx(s) sent to pharmacy electronically.  

## 2015-02-06 ENCOUNTER — Other Ambulatory Visit: Payer: Self-pay

## 2015-02-06 ENCOUNTER — Other Ambulatory Visit: Payer: Self-pay | Admitting: Internal Medicine

## 2015-03-07 ENCOUNTER — Encounter: Payer: Self-pay | Admitting: Internal Medicine

## 2015-03-07 ENCOUNTER — Ambulatory Visit (INDEPENDENT_AMBULATORY_CARE_PROVIDER_SITE_OTHER): Payer: Medicare Other | Admitting: Internal Medicine

## 2015-03-07 ENCOUNTER — Other Ambulatory Visit: Payer: Self-pay | Admitting: Internal Medicine

## 2015-03-07 VITALS — BP 148/88 | HR 73

## 2015-03-07 DIAGNOSIS — E785 Hyperlipidemia, unspecified: Secondary | ICD-10-CM

## 2015-03-07 DIAGNOSIS — Z9989 Dependence on other enabling machines and devices: Secondary | ICD-10-CM

## 2015-03-07 DIAGNOSIS — I447 Left bundle-branch block, unspecified: Secondary | ICD-10-CM

## 2015-03-07 DIAGNOSIS — Z9889 Other specified postprocedural states: Secondary | ICD-10-CM

## 2015-03-07 DIAGNOSIS — I10 Essential (primary) hypertension: Secondary | ICD-10-CM

## 2015-03-07 DIAGNOSIS — I480 Paroxysmal atrial fibrillation: Secondary | ICD-10-CM | POA: Diagnosis not present

## 2015-03-07 DIAGNOSIS — G4733 Obstructive sleep apnea (adult) (pediatric): Secondary | ICD-10-CM

## 2015-03-07 DIAGNOSIS — Z79899 Other long term (current) drug therapy: Secondary | ICD-10-CM

## 2015-03-07 DIAGNOSIS — N400 Enlarged prostate without lower urinary tract symptoms: Secondary | ICD-10-CM | POA: Insufficient documentation

## 2015-03-07 DIAGNOSIS — Z8679 Personal history of other diseases of the circulatory system: Secondary | ICD-10-CM

## 2015-03-07 NOTE — Patient Instructions (Signed)
Your physician wants you to follow-up in: 1 year with Dr. Debara Pickett. You will receive a reminder letter in the mail two months in advance. If you don't receive a letter, please call our office to schedule the follow-up appointment.  Your physician recommends that you return for lab work FASTING (lipid and CMET)

## 2015-03-07 NOTE — Telephone Encounter (Signed)
Rx(s) sent to pharmacy electronically.  

## 2015-03-07 NOTE — Progress Notes (Signed)
OFFICE NOTE  Chief Complaint:  No complaints  Primary Care Physician: No PCP Per Patient  HPI:  Edward [name] is a pleasant 69 year old retired [employer] who has a history of sick sinus syndrome and paroxysmal atrial fibrillation in the past. He underwent complex ablation by Dr. Ola Spurr at [facility] in 2009. Subsequently he had a isthmus ablation for atrial flutter. He also has obstructive sleep apnea, BPH, hypertension, dyslipidemia and a former smoking history. Recently he suffered a fracture of the right ankle and has had subsequent swelling and pain as well as warmth of the right ankle and calf. He also has a known left bundle branch block and he underwent stress testing in 2013 which was negative for ischemia. He hasn't had an echocardiogram at that time which showed an EF greater than 55% with moderately dilated atria and mild aortic regurgitation.  At his last office visit he had some lower extremity swelling with warmth and signs of congestion. I ordered an ultrasound in the office to rule out DVT which was negative. He still reports intermittent swelling and has had some improvement with wearing stockings but is not wearing them currently due to the hot weather.  Edward [name] returns today for follow-up. Overall he is doing fairly well. He denies any recurrent palpitations or atrial fibrillation. His CHADSVASC score is 1 and he remains on aspirin. He's been struggling with prostate enlargement is followed by Dr. Roni Bread. He's been taking Crestor with some mild memory impairment which he says is better if he misses a dose. Overall is cause a marked improvement in his cholesterol which is reduced by about 50%. He's due for repeat cholesterol test today. Overall blood pressure control is been fairly good although it is a little bit labile. He still has some right leg swelling.  PMHx:  Past Medical History  Diagnosis Date  . Hypertension   . Hyperlipidemia   .  Back pain   . Knee pain     bilat arthritis  . Atrial fibrillation     PAF; ablation in 2009  . Ruptured lumbar disc   . SSS (sick sinus syndrome)   . OSA (obstructive sleep apnea)     CPAP; sleep study 06/2007 - AHI during total sleep 25.95/hr and during REM 32.57/hr (moderate sleep apnea); postural component w/severe sleep apnea during supine (AHI 76.36/hr)  . BPH (benign prostatic hyperplasia)   . History of nuclear stress test 11/2011    lexiscan; mild ischemia in apical inferior & apical lateral regions    Past Surgical History  Procedure Laterality Date  . Back surgery  1981  . Knee surgery Bilateral 1961, 1989, 1991    3 surg on right/ 1 surg on left  . Colonoscopy    . Nose surgery      couple surg  . Atrial fibrillation ablation  2009    [facility] - isthmius tricuspid abaltion w/no evidence of ABNRT  . Transthoracic echocardiogram  04/2012    EF=>55%; mod conc LVH; LA mod dilated; mild MR; trace TR; AV mildly sclerotic & mild regurg; aortic root sclerosis/calcif    FAMHx:  Family History  Problem Relation Age of Onset  . Adopted: Yes    SOCHx:   reports that he quit smoking about 47 years ago. His smoking use included Pipe. He has never used smokeless tobacco. He reports that he drinks alcohol. He reports that he does not use illicit drugs.  ALLERGIES:  Allergies  Allergen Reactions  .  Beta Adrenergic Blockers   . Betapace [Sotalol Hcl]     ROS: A comprehensive review of systems was negative except for: Constitutional: positive for fatigue Cardiovascular: positive for lower extremity edema Musculoskeletal: positive for arthralgias  HOME MEDS: Current Outpatient Prescriptions  Medication Sig Dispense Refill  . aspirin 325 MG tablet Take 325 mg by mouth daily.    . Coenzyme Q10 (CO Q 10) 100 MG CAPS Take 200 mg by mouth daily.    . fish oil-omega-3 fatty acids 1000 MG capsule Take 2 g by mouth daily.    . Ibuprofen (ADVIL PO) Take by mouth as needed.    .  irbesartan (AVAPRO) 300 MG tablet TAKE 1 TABLET(300 MG) BY MOUTH AT BEDTIME 30 tablet 6  . Magnesium 400 MG CAPS Take 1 capsule by mouth daily.    . Multiple Vitamin (MULTIVITAMIN) tablet Take 1 tablet by mouth daily.    . ranitidine (ZANTAC) 150 MG tablet Take 150 mg by mouth every morning.     . rosuvastatin (CRESTOR) 20 MG tablet Take 1 tablet (20 mg total) by mouth daily. Need appointment before anymore refills 30 tablet 0  . rosuvastatin (CRESTOR) 20 MG tablet TAKE 1 TABLET BY MOUTH EVERY DAY 30 tablet 6  . TEKTURNA 150 MG tablet TAKE 1 TABLET BY MOUTH EVERY DAY 30 tablet 8  . triamterene-hydrochlorothiazide (MAXZIDE-25) 37.5-25 MG per tablet Take 1 tablet by mouth daily. NEEDS APPOINTMENT FOR FUTURE REFILLS 30 tablet 0   Current Facility-Administered Medications  Medication Dose Route Frequency Provider Last Rate Last Dose  . 0.9 %  sodium chloride infusion  500 mL Intravenous Continuous Sable Feil, MD        LABS/IMAGING: No results found for this or any previous visit (from the past 48 hour(s)). No results found.  VITALS: BP 148/88 mmHg  Pulse 73  EXAM: General appearance: alert and no distress Neck: no carotid bruit and no JVD Lungs: clear to auscultation bilaterally Heart: regular rate and rhythm, S1, S2 normal, no murmur, click, rub or gallop Abdomen: soft, non-tender; bowel sounds normal; no masses,  no organomegaly Extremities: no significant edema Pulses: 2+ and symmetric Skin: no skin discoloration Neurologic: Grossly normal Psych: Mildly anxious  EKG: Normal sinus rhythm at 73, left bundle branch block  ASSESSMENT: 1. Left bundle branch block 2. History of sick sinus syndrome 3. Paroxysmal atrial fibrillation status post AF-ablation and a-flutter ablation 4. Right ankle trauma with subsequent swelling, erythema and pain 5. HTN 6. Dyslipidemia  PLAN: 1.   Edward [name] is doing fairly well with regard to his blood pressure and cholesterol. He is a  stable left bundle branch block denies any chest pain. He is in sinus rhythm today. His leg swelling comes and goes and has generally resolved. He seems to be tolerating Crestor with a marked improvement in cholesterol. He is due for recheck. He seems to have some mild memory impairment which I do not think is related to his cholesterol medicine however he does feel that if he skips a dose he feels better almost immediately the next day. Overall it seems to be fairly stable and therefore I recommend he continue on his Crestor. He has been having some problems with BPH recently and nocturia. He was inquiring about coming off of his thiazide but he would probably need to go on to a different blood pressure medication. He wishes to remain on the same medicines for now. Plan to see him back annually or sooner as necessary.  Pixie Casino, MD, [facility] Attending Cardiologist [place] 03/07/2015, 3:18 PM

## 2015-03-08 LAB — COMPREHENSIVE METABOLIC PANEL
ALBUMIN: 4.6 g/dL (ref 3.6–5.1)
ALT: 25 U/L (ref 9–46)
AST: 25 U/L (ref 10–35)
Alkaline Phosphatase: 43 U/L (ref 40–115)
BUN: 25 mg/dL (ref 7–25)
CHLORIDE: 106 meq/L (ref 98–110)
CO2: 25 meq/L (ref 20–31)
Calcium: 9.5 mg/dL (ref 8.6–10.3)
Creat: 1.08 mg/dL (ref 0.70–1.18)
Glucose, Bld: 108 mg/dL — ABNORMAL HIGH (ref 65–99)
POTASSIUM: 3.7 meq/L (ref 3.5–5.3)
Sodium: 142 mEq/L (ref 135–146)
Total Bilirubin: 0.8 mg/dL (ref 0.2–1.2)
Total Protein: 7.3 g/dL (ref 6.1–8.1)

## 2015-03-09 LAB — NMR LIPOPROFILE WITH LIPIDS
Cholesterol, Total: 131 mg/dL (ref 100–199)
HDL Particle Number: 30.1 umol/L — ABNORMAL LOW (ref >=30.5)
HDL Size: 8.5 nm — ABNORMAL LOW (ref >=9.2)
HDL-C: 41 mg/dL (ref >39)
LDL (calc): 64 mg/dL (ref 0–99)
LDL Particle Number: 917 nmol/L (ref <1000)
LDL SIZE: 20.4 nm (ref >=20.8)
LP-IR Score: 49 — ABNORMAL HIGH (ref <=45)
Large HDL-P: 2.3 umol/L — ABNORMAL LOW (ref >=4.8)
Large VLDL-P: 1 nmol/L (ref <=2.7)
SMALL LDL PARTICLE NUMBER: 446 nmol/L (ref <=527)
TRIGLYCERIDES: 130 mg/dL (ref 0–149)
VLDL Size: 41.6 nm (ref <=46.6)
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
## 2015-07-02 ENCOUNTER — Other Ambulatory Visit: Payer: Self-pay | Admitting: Internal Medicine

## 2015-07-27 ENCOUNTER — Other Ambulatory Visit: Payer: Self-pay | Admitting: Internal Medicine

## 2015-07-27 NOTE — Telephone Encounter (Signed)
Rx(s) sent to pharmacy electronically.  

## 2015-08-14 ENCOUNTER — Other Ambulatory Visit: Payer: Self-pay | Admitting: Internal Medicine

## 2015-08-15 NOTE — Telephone Encounter (Signed)
Rx request sent to pharmacy.  

## 2015-09-15 ENCOUNTER — Other Ambulatory Visit: Payer: Self-pay | Admitting: Internal Medicine

## 2015-09-15 NOTE — Telephone Encounter (Signed)
Rx request sent to pharmacy.  

## 2015-11-07 ENCOUNTER — Telehealth: Payer: Self-pay | Admitting: *Deleted

## 2015-11-07 MED ORDER — ALISKIREN FUMARATE 150 MG PO TABS: 150.0000 mg | ORAL_TABLET | Freq: Every day | ORAL | Status: DC

## 2015-11-07 NOTE — Telephone Encounter (Signed)
PATIENT STATES NEEDS REFILL OF TEKTURNA  150 MG  #90  X 3. PRESCRIPTION E-SENT TO PHARMACY PATIENT AWARE ALSO WANTED TO KNOW WHEN NEXT APPOINTMENT IS SCHEDULE July 2017 - RN  informed patient to call middle of April - get an appointment schedule Patient verbalized understanding.

## 2016-03-11 ENCOUNTER — Encounter: Payer: Self-pay | Admitting: Internal Medicine

## 2016-03-11 ENCOUNTER — Ambulatory Visit (INDEPENDENT_AMBULATORY_CARE_PROVIDER_SITE_OTHER): Payer: Medicare Other | Admitting: Internal Medicine

## 2016-03-11 VITALS — BP 150/90 | HR 67 | Ht 75.0 in | Wt 259.0 lb

## 2016-03-11 DIAGNOSIS — I447 Left bundle-branch block, unspecified: Secondary | ICD-10-CM

## 2016-03-11 DIAGNOSIS — Z9889 Other specified postprocedural states: Secondary | ICD-10-CM | POA: Diagnosis not present

## 2016-03-11 DIAGNOSIS — E785 Hyperlipidemia, unspecified: Secondary | ICD-10-CM

## 2016-03-11 DIAGNOSIS — Z8679 Personal history of other diseases of the circulatory system: Secondary | ICD-10-CM

## 2016-03-11 DIAGNOSIS — Z9989 Dependence on other enabling machines and devices: Secondary | ICD-10-CM

## 2016-03-11 DIAGNOSIS — I10 Essential (primary) hypertension: Secondary | ICD-10-CM | POA: Diagnosis not present

## 2016-03-11 DIAGNOSIS — G4733 Obstructive sleep apnea (adult) (pediatric): Secondary | ICD-10-CM

## 2016-03-11 LAB — LIPID PANEL
CHOLESTEROL: 105 mg/dL — AB (ref 125–200)
HDL: 45 mg/dL (ref >=40)
LDL CALC: 44 mg/dL (ref <130)
TRIGLYCERIDES: 81 mg/dL (ref <150)
Total CHOL/HDL Ratio: 2.3 Ratio (ref <=5.0)
VLDL: 16 mg/dL (ref <30)

## 2016-03-11 MED ORDER — AMLODIPINE BESYLATE 5 MG PO TABS: 5.0000 mg | ORAL_TABLET | Freq: Every day | ORAL | 3 refills | Status: DC

## 2016-03-11 NOTE — Patient Instructions (Addendum)
Medication Instructions:  START Amlodipine 5mg  ; take 1 tab by mouth daily.  Labwork: Your physician recommends that you return for lab work: TODAY: BMET   Testing/Procedures: None ordered  Follow-Up: Your physician recommends that you schedule a follow-up appointment in: 2 weeks with Leonidas.   Your physician wants you to follow-up in: [place]. You will receive a reminder letter in the mail two months in advance. If you don't receive a letter, please call our office to schedule the follow-up appointment.   Any Other Special Instructions Will Be Listed Below (If Applicable).     If you need a refill on your cardiac medications before your next appointment, please call your pharmacy.

## 2016-03-11 NOTE — Progress Notes (Signed)
OFFICE NOTE  Chief Complaint:  No complaints  Primary Care Physician: No PCP Per Patient  HPI:  Edward [name] is a pleasant 70 year old retired [place] who has a history of sick sinus syndrome and paroxysmal atrial fibrillation in the past. He underwent complex ablation by Edward [name] at [facility] in 2009. Subsequently he had a isthmus ablation for atrial flutter. He also has obstructive sleep apnea, BPH, hypertension, dyslipidemia and a former smoking history. Recently he suffered a fracture of the right ankle and has had subsequent swelling and pain as well as warmth of the right ankle and calf. He also has a known left bundle branch block and he underwent stress testing in 2013 which was negative for ischemia. He hasn't had an echocardiogram at that time which showed an EF greater than 55% with moderately dilated atria and mild aortic regurgitation.  At his last office visit he had some lower extremity swelling with warmth and signs of congestion. I ordered an ultrasound in the office to rule out DVT which was negative. He still reports intermittent swelling and has had some improvement with wearing stockings but is not wearing them currently due to the hot weather.  Edward [name] returns today for follow-up. Overall he is doing fairly well. He denies any recurrent palpitations or atrial fibrillation. His CHADSVASC score is 1 and he remains on aspirin. He's been struggling with prostate enlargement is followed by Edward [name]. He's been taking Crestor with some mild memory impairment which he says is better if he misses a dose. Overall is cause a marked improvement in his cholesterol which is reduced by about 50%. He's due for repeat cholesterol test today. Overall blood pressure control is been fairly good although it is a little bit labile. He still has some right leg swelling.  03/11/2016  Edward [name] returns today for follow-up. He denies any chest pain or  worsening shortness of breath. He recalled a recent trip to [place] where he did hiking of 6-10 miles in over 100 temperature is without any significant problems. Blood pressure however is elevated at 150/90 today. Recheck was essentially unchanged. He occasionally does not take his diuretics when he is going to be outside in hot weather. He did take all this medicine today.  PMHx:  Past Medical History:  Diagnosis Date  . Atrial fibrillation    PAF; ablation in 2009  . Back pain   . BPH (benign prostatic hyperplasia)   . History of nuclear stress test 11/2011   lexiscan; mild ischemia in apical inferior & apical lateral regions  . Hyperlipidemia   . Hypertension   . Knee pain    bilat arthritis  . OSA (obstructive sleep apnea)    CPAP; sleep study 06/2007 - AHI during total sleep 25.95/hr and during REM 32.57/hr (moderate sleep apnea); postural component w/severe sleep apnea during supine (AHI 76.36/hr)  . Ruptured lumbar disc   . SSS (sick sinus syndrome)     Past Surgical History:  Procedure Laterality Date  . ATRIAL FIBRILLATION ABLATION  2009   [facility] - isthmius tricuspid abaltion w/no evidence of ABNRT  . BACK SURGERY  1981  . COLONOSCOPY    . KNEE SURGERY Bilateral [place]   3 surg on right/ 1 surg on left  . NOSE SURGERY     couple surg  . TRANSTHORACIC ECHOCARDIOGRAM  04/2012   EF=>55%; mod conc LVH; LA mod dilated; mild MR; trace TR; AV mildly sclerotic & mild  regurg; aortic root sclerosis/calcif    FAMHx:  Family History  Problem Relation Age of Onset  . Adopted: Yes    SOCHx:   reports that he quit smoking about 48 years ago. His smoking use included Pipe. He quit after 35.00 years of use. He has never used smokeless tobacco. He reports that he drinks alcohol. He reports that he does not use drugs.  ALLERGIES:  Allergies  Allergen Reactions  . Beta Adrenergic Blockers   . Betapace [Sotalol Hcl]     ROS: Pertinent items noted in HPI and remainder  of comprehensive ROS otherwise negative.  HOME MEDS: Current Outpatient Prescriptions  Medication Sig Dispense Refill  . aliskiren (TEKTURNA) 150 MG tablet Take 1 tablet (150 mg total) by mouth daily. 90 tablet 3  . aspirin 325 MG tablet Take 325 mg by mouth daily.    . Coenzyme Q10 (CO Q 10) 100 MG CAPS Take 200 mg by mouth daily.    . fish oil-omega-3 fatty acids 1000 MG capsule Take 2 g by mouth daily.    . Ibuprofen (ADVIL PO) Take by mouth as needed.    . irbesartan (AVAPRO) 300 MG tablet TAKE 1 TABLET(300 MG) BY MOUTH AT BEDTIME 90 tablet 2  . Magnesium 400 MG CAPS Take 1 capsule by mouth daily.    . Multiple Vitamin (MULTIVITAMIN) tablet Take 1 tablet by mouth daily.    . ranitidine (ZANTAC) 150 MG tablet Take 150 mg by mouth every morning.     . rosuvastatin (CRESTOR) 20 MG tablet TAKE 1 TABLET BY MOUTH EVERY DAY 90 tablet 1  . triamterene-hydrochlorothiazide (MAXZIDE-25) 37.5-25 MG tablet TAKE 1 TABLET BY MOUTH EVERY DAY 30 tablet 6   Current Facility-Administered Medications  Medication Dose Route Frequency Provider Last Rate Last Dose  . 0.9 %  sodium chloride infusion  500 mL Intravenous Continuous Sable Feil, MD        LABS/IMAGING: No results found for this or any previous visit (from the past 48 hour(s)). No results found.  VITALS: There were no vitals taken for this visit.  EXAM: General appearance: alert and no distress Neck: no carotid bruit and no JVD Lungs: clear to auscultation bilaterally Heart: regular rate and rhythm, S1, S2 normal, no murmur, click, rub or gallop Abdomen: soft, non-tender; bowel sounds normal; no masses,  no organomegaly Extremities: no significant edema Pulses: 2+ and symmetric Skin: no skin discoloration Neurologic: Grossly normal Psych: Mildly anxious  EKG: Sinus rhythm with PACs at 67  ASSESSMENT: 1. Left bundle branch block 2. History of sick sinus syndrome 3. Paroxysmal atrial fibrillation status post AF-ablation  and a-flutter ablation 4. HTN 5. Dyslipidemia 6. Hypertension-uncontrolled  PLAN: 1.   Edward [name] is doing well without any symptoms of chest pain or shortness of breath. He denies any recurrent AF. His left bundle branch block is stable. Blood pressure is not at goal today and I'd like to add amlodipine 5 mg daily for additional benefit. We'll schedule him to see our pharmacist in the hypertension clinic in 2 weeks. He is due for repeat cholesterol check and is fasting today therefore will send him to the lab. Follow-up with me annually or sooner as necessary.   Pixie Casino, MD, [facility] Attending Cardiologist [place] C Onofre Gains 03/11/2016, 1:31 PM

## 2016-03-19 ENCOUNTER — Other Ambulatory Visit: Payer: Self-pay | Admitting: Internal Medicine

## 2016-03-20 NOTE — Telephone Encounter (Signed)
Rx request sent to pharmacy.  

## 2016-04-01 ENCOUNTER — Ambulatory Visit (INDEPENDENT_AMBULATORY_CARE_PROVIDER_SITE_OTHER): Payer: Medicare Other | Admitting: Pharmacist

## 2016-04-01 VITALS — BP 152/98 | Ht 75.0 in | Wt 259.0 lb

## 2016-04-01 DIAGNOSIS — I10 Essential (primary) hypertension: Secondary | ICD-10-CM | POA: Diagnosis not present

## 2016-04-01 MED ORDER — ALISKIREN FUMARATE 300 MG PO TABS: 150.0000 mg | ORAL_TABLET | Freq: Every day | ORAL | 0 refills | Status: DC

## 2016-04-01 MED ORDER — ALISKIREN FUMARATE 300 MG PO TABS: 300.0000 mg | ORAL_TABLET | Freq: Every day | ORAL | 0 refills | Status: DC

## 2016-04-01 NOTE — Patient Instructions (Addendum)
Return for a a follow up appointment in 3-4 weeks  Check your blood pressure at home daily (if able) and keep record of the readings.  Take your BP meds as follows: Increase Tekturna to 300mg  once daily (you may take 2 of your current supply until you pick up the higher dose)  Please come for labs in 2 weeks.   Bring all of your meds, your BP cuff and your record of home blood pressures to your next appointment.  Exercise as you're able, try to walk approximately 30 minutes per day.  Keep salt intake to a minimum, especially watch canned and prepared boxed foods.  Eat more fresh fruits and vegetables and fewer canned items.  Avoid eating in fast food restaurants.    HOW TO TAKE YOUR BLOOD PRESSURE: . Rest 5 minutes before taking your blood pressure. .  Don't smoke or drink caffeinated beverages for at least 30 minutes before. . Take your blood pressure before (not after) you eat. . Sit comfortably with your back supported and both feet on the floor (don't cross your legs). . Elevate your arm to heart level on a table or a desk. . Use the proper sized cuff. It should fit smoothly and snugly around your bare upper arm. There should be enough room to slip a fingertip under the cuff. The bottom edge of the cuff should be 1 inch above the crease of the elbow. . Ideally, take 3 measurements at one sitting and record the average.
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
## 2016-04-06 ENCOUNTER — Other Ambulatory Visit: Payer: Self-pay | Admitting: Internal Medicine

## 2016-04-08 NOTE — Telephone Encounter (Signed)
Rx(s) sent to pharmacy electronically.  

## 2016-04-16 ENCOUNTER — Ambulatory Visit (INDEPENDENT_AMBULATORY_CARE_PROVIDER_SITE_OTHER): Payer: Medicare Other | Admitting: Pharmacist Clinician (PhC)/ Clinical Pharmacy Specialist

## 2016-04-16 VITALS — BP 136/82 | HR 60 | Ht 75.0 in | Wt 259.0 lb

## 2016-04-16 DIAGNOSIS — I10 Essential (primary) hypertension: Secondary | ICD-10-CM | POA: Diagnosis not present

## 2016-04-16 NOTE — Progress Notes (Signed)
Patient ID: NIV LUISI                 DOB: July 27, 1946                      MRN: [id number]     HPI: Edward [name] is a 70 y.o. male patient of Dr. Debara Pickett with PMH below who presents today for hypertension follow up.  He saw Tana Coast PharmD about 2 weeks ago at which time his Marisa Severin was increased from 150 mg to 300 mg once daily.  He had been taking the 300 mg dose on occasion and found it to drop his BP significantly.    Since increasing the dose he reports no problems or side effects.  He was to have gone to the lab last week for BMET, but there was some confusion and he went prior to his appointment with [place] today.    Cardiac Hx: LBBB, SSS, PAF, HTN, OSA on CPAP, BPH, HLD, right ankle swelling (secondary to break)  Current HTN meds:  Tekturna 300mg  daily in the morning Amlodipine 5mg  daily in the morning Irbesartan 300mg  daily in the evening Triamterene-hct 37.5/25mg  daily in the morning  Previously tried:  Toprol - which caused him to fall asleep while driving and be extremely lethargic as well as dropped HR   BP goal: <150/90  Family History: unknown - he was adopted.   Social History: He quit smoking in 1969. He stopped using chew in the 80s. He rarely drinks alcohol.   Diet: he eats about half his meals from home and half his meals from out. He avoid fast food. He states he never adds salt at the table. He drinks 1-2 cups of decaf coffee (stating rarely he will have regular if he is extremely tired). He rarely drinks tea and never drinks soda.   Exercise: He walks or rides his bike for about 35-50 minutes several days a week.   Home BP readings:  Home Omron wrist cuff, brings 5 readings with him today.  Range of 124-147/66-78 with an average of 135/72.  He did not bring the cuff in for verification.    Wt Readings from Last 3 Encounters:  04/16/16 259 lb (117.5 kg)  04/01/16 259 lb (117.5 kg)  03/11/16 259 lb (117.5 kg)   BP Readings from Last 3 Encounters:    04/16/16 136/82  04/01/16 (!) 152/98  03/11/16 (!) 150/90   Pulse Readings from Last 3 Encounters:  04/16/16 60  03/11/16 67  03/07/15 73    Renal function: CrCl cannot be calculated (Patient's most recent lab result is older than the maximum 21 days allowed.).  Past Medical History:  Diagnosis Date  . Atrial fibrillation ([place])    PAF; ablation in 2009  . Back pain   . BPH (benign prostatic hyperplasia)   . History of nuclear stress test 11/2011   lexiscan; mild ischemia in apical inferior & apical lateral regions  . Hyperlipidemia   . Hypertension   . Knee pain    bilat arthritis  . OSA (obstructive sleep apnea)    CPAP; sleep study 06/2007 - AHI during total sleep 25.95/hr and during REM 32.57/hr (moderate sleep apnea); postural component w/severe sleep apnea during supine (AHI 76.36/hr)  . Ruptured lumbar disc   . SSS (sick sinus syndrome) [facility])     Current Outpatient Prescriptions on File Prior to Visit  Medication Sig Dispense Refill  . aliskiren (TEKTURNA) 300 MG tablet Take  1 tablet (300 mg total) by mouth daily. 30 tablet 0  . amLODipine (NORVASC) 5 MG tablet Take 1 tablet (5 mg total) by mouth daily. 180 tablet 3  . aspirin 325 MG tablet Take 325 mg by mouth daily.    Marland Kitchen CIALIS 20 MG tablet Take 20 mg by mouth daily as needed.    . Coenzyme Q10 (CO Q 10) 100 MG CAPS Take 200 mg by mouth daily.    . fish oil-omega-3 fatty acids 1000 MG capsule Take 2 g by mouth daily.    . Ibuprofen (ADVIL PO) Take by mouth as needed.    . irbesartan (AVAPRO) 300 MG tablet TAKE 1 TABLET(300 MG) BY MOUTH AT BEDTIME 90 tablet 2  . Magnesium 400 MG CAPS Take 200 mg by mouth 2 (two) times daily.     . Multiple Vitamin (MULTIVITAMIN) tablet Take 1 tablet by mouth daily.    . ranitidine (ZANTAC) 150 MG tablet Take 150 mg by mouth every morning.     . rosuvastatin (CRESTOR) 20 MG tablet TAKE 1 TABLET BY MOUTH EVERY DAY 90 tablet 3  . triamterene-hydrochlorothiazide (MAXZIDE-25) 37.5-25  MG tablet TAKE 1 TABLET BY MOUTH EVERY DAY 30 tablet 6  . triamterene-hydrochlorothiazide (MAXZIDE-25) 37.5-25 MG tablet TAKE 1 TABLET BY MOUTH DAILY 90 tablet 2   Current Facility-Administered Medications on File Prior to Visit  Medication Dose Route Frequency Provider Last Rate Last Dose  . 0.9 %  sodium chloride infusion  500 mL Intravenous Continuous Sable Feil, MD        Allergies  Allergen Reactions  . Beta Adrenergic Blockers   . Betapace [Sotalol Hcl]     Blood pressure 136/82, pulse 60, height 6\' 3"  (1.905 m), weight 259 lb (117.5 kg).   Assessment/Plan: Hypertension: BP is at goal today. Will watch for lab results to verify that his potassium is still WNL.  He is to continue with his current medications and knows to call should he see home BP readings go above the [id number] systolic mark.    Thank you, Tommy Medal PharmD CPP [facility]  04/16/2016 1:38 PM

## 2016-04-16 NOTE — Patient Instructions (Signed)
   Your blood pressure today is 136/82  (goal is to stay < 150/90)  Check your blood pressure at home several days each week and keep record of the readings.  Take your BP meds as follows:  Continue with all current medications  Bring all of your meds, your BP cuff and your record of home blood pressures to your next appointment.  Exercise as you're able, try to walk approximately 30 minutes per day.  Keep salt intake to a minimum, especially watch canned and prepared boxed foods.  Eat more fresh fruits and vegetables and fewer canned items.  Avoid eating in fast food restaurants.    HOW TO TAKE YOUR BLOOD PRESSURE: . Rest 5 minutes before taking your blood pressure. .  Don't smoke or drink caffeinated beverages for at least 30 minutes before. . Take your blood pressure before (not after) you eat. . Sit comfortably with your back supported and both feet on the floor (don't cross your legs). . Elevate your arm to heart level on a table or a desk. . Use the proper sized cuff. It should fit smoothly and snugly around your bare upper arm. There should be enough room to slip a fingertip under the cuff. The bottom edge of the cuff should be 1 inch above the crease of the elbow. . Ideally, take 3 measurements at one sitting and record the average.

## 2016-04-17 LAB — BASIC METABOLIC PANEL
BUN: 16 mg/dL (ref 7–25)
CALCIUM: 9.7 mg/dL (ref 8.6–10.3)
CO2: 28 mmol/L (ref 20–31)
Chloride: 102 mmol/L (ref 98–110)
Creat: 1.01 mg/dL (ref 0.70–1.18)
Glucose, Bld: 133 mg/dL — ABNORMAL HIGH (ref 65–99)
POTASSIUM: 4.1 mmol/L (ref 3.5–5.3)
SODIUM: 140 mmol/L (ref 135–146)

## 2016-04-30 ENCOUNTER — Other Ambulatory Visit: Payer: Self-pay | Admitting: Internal Medicine

## 2016-04-30 NOTE — Telephone Encounter (Signed)
Rx request sent to pharmacy.  

## 2016-06-30 ENCOUNTER — Other Ambulatory Visit: Payer: Self-pay | Admitting: Internal Medicine

## 2016-07-02 NOTE — Telephone Encounter (Signed)
Rx request sent to pharmacy.  

## 2017-01-29 ENCOUNTER — Other Ambulatory Visit: Payer: Self-pay | Admitting: Internal Medicine

## 2017-03-19 ENCOUNTER — Other Ambulatory Visit: Payer: Self-pay | Admitting: Internal Medicine

## 2017-04-15 ENCOUNTER — Ambulatory Visit (INDEPENDENT_AMBULATORY_CARE_PROVIDER_SITE_OTHER): Payer: Medicare Other | Admitting: Internal Medicine

## 2017-04-15 ENCOUNTER — Encounter: Payer: Self-pay | Admitting: Internal Medicine

## 2017-04-15 VITALS — BP 158/86 | HR 67 | Ht 75.0 in | Wt 252.4 lb

## 2017-04-15 DIAGNOSIS — I447 Left bundle-branch block, unspecified: Secondary | ICD-10-CM

## 2017-04-15 DIAGNOSIS — I480 Paroxysmal atrial fibrillation: Secondary | ICD-10-CM

## 2017-04-15 DIAGNOSIS — G4733 Obstructive sleep apnea (adult) (pediatric): Secondary | ICD-10-CM | POA: Diagnosis not present

## 2017-04-15 DIAGNOSIS — Z9989 Dependence on other enabling machines and devices: Secondary | ICD-10-CM

## 2017-04-15 DIAGNOSIS — Z8249 Family history of ischemic heart disease and other diseases of the circulatory system: Secondary | ICD-10-CM

## 2017-04-15 DIAGNOSIS — Z9889 Other specified postprocedural states: Secondary | ICD-10-CM

## 2017-04-15 DIAGNOSIS — Z8679 Personal history of other diseases of the circulatory system: Secondary | ICD-10-CM

## 2017-04-15 NOTE — Patient Instructions (Addendum)
Your physician has requested that you have an abdominal aorta duplex. During this test, an ultrasound is used to evaluate the aorta. Allow 30 minutes for this exam. Do not eat after midnight the day before and avoid carbonated beverages  Your physician wants you to follow-up in: ONE YEAR with Dr. Debara Pickett. You will receive a reminder letter in the mail two months in advance. If you don't receive a letter, please call our office to schedule the follow-up appointment.

## 2017-04-15 NOTE — Progress Notes (Signed)
OFFICE NOTE  Chief Complaint:  Doing well  Primary Care Physician: [place]  HPI:  Edward [name] is a pleasant 71 year old retired [place] who has a history of sick sinus syndrome and paroxysmal atrial fibrillation in the past. He underwent complex ablation by Dr. Ola Spurr at [facility] in 2009. Subsequently he had a isthmus ablation for atrial flutter. He also has obstructive sleep apnea, BPH, hypertension, dyslipidemia and a former smoking history. Recently he suffered a fracture of the right ankle and has had subsequent swelling and pain as well as warmth of the right ankle and calf. He also has a known left bundle branch block and he underwent stress testing in 2013 which was negative for ischemia. He hasn't had an echocardiogram at that time which showed an EF greater than 55% with moderately dilated atria and mild aortic regurgitation.  At his last office visit he had some lower extremity swelling with warmth and signs of congestion. I ordered an ultrasound in the office to rule out DVT which was negative. He still reports intermittent swelling and has had some improvement with wearing stockings but is not wearing them currently due to the hot weather.  Edward [name] returns today for follow-up. Overall he is doing fairly well. He denies any recurrent palpitations or atrial fibrillation. His CHADSVASC score is 1 and he remains on aspirin. He's been struggling with prostate enlargement is followed by Dr. Roni Bread. He's been taking Crestor with some mild memory impairment which he says is better if he misses a dose. Overall is cause a marked improvement in his cholesterol which is reduced by about 50%. He's due for repeat cholesterol test today. Overall blood pressure control is been fairly good although it is a little bit labile. He still has some right leg swelling.  03/11/2016  Mr. Belinsky returns today for follow-up. He denies any chest pain or  worsening shortness of breath. He recalled a recent trip to [place] where he did hiking of 6-10 miles in over 100 temperature is without any significant problems. Blood pressure however is elevated at 150/90 today. Recheck was essentially unchanged. He occasionally does not take his diuretics when he is going to be outside in hot weather. He did take all this medicine today.  In 04/15/2017  Edward [name] returns today for annual follow-up. He recently established with a primary care provider. He had an extensive lab panel performed by private company. This indicated an elevated hemoglobin A1c of close to 8. His fasting blood sugar subsequently was improved however he made significant dietary changes. Nevertheless he is on low-dose metformin. He reports compliance with CPAP. His A. fib is not bothersome and is not recently had any episodes - he has a history of ablation in 2009 and has had no recurrence. He is on aspirin for this. Blood pressure is well-controlled today as a recheck blood pressure came down to 128/82. He denies any chest pain or worsening shortness of breath. He reports some concern about abdominal aneurysm. He reports his father had one and that died from it.  PMHx:  Past Medical History:  Diagnosis Date  . Atrial fibrillation (Edward [name])    PAF; ablation in 2009  . Back pain   . BPH (benign prostatic hyperplasia)   . History of nuclear stress test 11/2011   lexiscan; mild ischemia in apical inferior & apical lateral regions  . Hyperlipidemia   . Hypertension   . Knee pain    bilat arthritis  .  OSA (obstructive sleep apnea)    CPAP; sleep study 06/2007 - AHI during total sleep 25.95/hr and during REM 32.57/hr (moderate sleep apnea); postural component w/severe sleep apnea during supine (AHI 76.36/hr)  . Ruptured lumbar disc   . SSS (sick sinus syndrome) [facility])     Past Surgical History:  Procedure Laterality Date  . ATRIAL FIBRILLATION ABLATION  2009   [facility] - isthmius tricuspid  abaltion w/no evidence of ABNRT  . BACK SURGERY  1981  . COLONOSCOPY    . KNEE SURGERY Bilateral [place]   3 surg on right/ 1 surg on left  . NOSE SURGERY     couple surg  . TRANSTHORACIC ECHOCARDIOGRAM  04/2012   EF=>55%; mod conc LVH; LA mod dilated; mild MR; trace TR; AV mildly sclerotic & mild regurg; aortic root sclerosis/calcif    FAMHx:  Family History  Problem Relation Age of Onset  . Adopted: Yes    SOCHx:   reports that he quit smoking about 49 years ago. His smoking use included Pipe. He quit after 35.00 years of use. He has never used smokeless tobacco. He reports that he drinks alcohol. He reports that he does not use drugs.  ALLERGIES:  Allergies  Allergen Reactions  . Beta Adrenergic Blockers   . Betapace [Sotalol Hcl]     ROS: Pertinent items noted in HPI and remainder of comprehensive ROS otherwise negative.  HOME MEDS: Current Outpatient Prescriptions  Medication Sig Dispense Refill  . aliskiren (TEKTURNA) 300 MG tablet Take 1 tablet (300 mg total) by mouth daily. OVERDUE FOR FOLLOW UP. CALL AND SCHEDULE FOR FURTHER REFILL [phone] 30 tablet 0  . amLODipine (NORVASC) 5 MG tablet Take 5 mg by mouth daily.    Marland Kitchen aspirin 325 MG tablet Take 325 mg by mouth daily.    Marland Kitchen CIALIS 20 MG tablet Take 20 mg by mouth daily as needed.    . Coenzyme Q10 (CO Q 10) 100 MG CAPS Take 200 mg by mouth daily.    . fish oil-omega-3 fatty acids 1000 MG capsule Take 2 g by mouth daily.    . Ibuprofen (ADVIL PO) Take by mouth as needed.    . irbesartan (AVAPRO) 300 MG tablet TAKE 1 TABLET(300 MG) BY MOUTH AT BEDTIME 90 tablet 1  . Magnesium 400 MG CAPS Take 200 mg by mouth 2 (two) times daily.     . metFORMIN (GLUCOPHAGE) 500 MG tablet Take 1 tablet by mouth daily.  3  . Multiple Vitamin (MULTIVITAMIN) tablet Take 1 tablet by mouth daily.    . ranitidine (ZANTAC) 150 MG tablet Take 150 mg by mouth every morning.     . rosuvastatin (CRESTOR) 20 MG tablet TAKE 1 TABLET BY  MOUTH EVERY DAY 90 tablet 3  . triamterene-hydrochlorothiazide (MAXZIDE-25) 37.5-25 MG tablet TAKE 1 TABLET BY MOUTH EVERY DAY 30 tablet 6   Current Facility-Administered Medications  Medication Dose Route Frequency Provider Last Rate Last Dose  . 0.9 %  sodium chloride infusion  500 mL Intravenous Continuous Sable Feil, MD        LABS/IMAGING: No results found for this or any previous visit (from the past 48 hour(s)). No results found.  VITALS: BP (!) 158/86   Pulse 67   Ht 6\' 3"  (1.905 m)   Wt 252 lb 6.4 oz (114.5 kg)   BMI 31.55 kg/m   EXAM: General appearance: alert and no distress Neck: no carotid bruit and no JVD Lungs: clear to auscultation bilaterally  Heart: regular rate and rhythm, S1, S2 normal, no murmur, click, rub or gallop Abdomen: soft, non-tender; bowel sounds normal; no masses,  no organomegaly and No pulsatile abdominal mass Extremities: no significant edema Pulses: 2+ and symmetric Skin: no skin discoloration Neurologic: Motor: grossly normal Psych: Mildly anxious  EKG: Sinus rhythm with PVCs at 67, LVH by voltage criteria-personally reviewed  ASSESSMENT: 1. Left bundle branch block 2. History of sick sinus syndrome 3. Paroxysmal atrial fibrillation status post AF-ablation and a-flutter ablation (no recurrence since 2009-on aspirin) 4. HTN 5. Dyslipidemia 6. Hypertension 7. Family history of AAA  PLAN: 1.   Mr. Staebell continues to do well and is many years out from A. fib/flutter ablation. He's had no recurrence and is on aspirin. He has a left bundle branch block has not suffered from bradycardia, presyncope or syncopal symptoms. Blood pressure is well-controlled. He had a recent lipid profile which we'll obtain the results of. Blood pressure was initially elevated 156/86 hours now improved to 128/82. We'll continue his current medications. Given his family history of AAA and is concerned will obtain a screening abdominal ultrasound. Plan to  see him back annually or sooner as necessary.  Pixie Casino, MD, [facility] Attending Cardiologist [place] 04/15/2017, 10:06 AM

## 2017-04-16 ENCOUNTER — Other Ambulatory Visit: Payer: Self-pay | Admitting: Internal Medicine

## 2017-04-25 ENCOUNTER — Other Ambulatory Visit: Payer: Self-pay | Admitting: Internal Medicine

## 2017-04-29 ENCOUNTER — Ambulatory Visit (HOSPITAL_COMMUNITY)
Admission: RE | Admit: 2017-04-29 | Discharge: 2017-04-29 | Disposition: A | Discharge status: Home / Self Care | Payer: Medicare Other | Source: Ambulatory Visit | Attending: Cardiovascular Disease | Admitting: Cardiovascular Disease

## 2017-04-29 DIAGNOSIS — Z136 Encounter for screening for cardiovascular disorders: Secondary | ICD-10-CM | POA: Diagnosis not present

## 2017-04-29 DIAGNOSIS — Z8249 Family history of ischemic heart disease and other diseases of the circulatory system: Secondary | ICD-10-CM | POA: Insufficient documentation

## 2017-04-29 DIAGNOSIS — I723 Aneurysm of iliac artery: Secondary | ICD-10-CM | POA: Diagnosis not present

## 2017-05-08 ENCOUNTER — Ambulatory Visit: Payer: Medicare Other | Admitting: Internal Medicine

## 2017-05-29 ENCOUNTER — Other Ambulatory Visit: Payer: Self-pay | Admitting: Internal Medicine

## 2017-05-29 NOTE — Telephone Encounter (Signed)
REFILL 

## 2017-06-12 ENCOUNTER — Ambulatory Visit: Payer: Medicare Other | Admitting: Internal Medicine

## 2017-06-20 ENCOUNTER — Other Ambulatory Visit: Payer: Self-pay | Admitting: Pharmacist

## 2017-06-20 NOTE — Patient Outreach (Signed)
Incoming call from Edward [name] in response to the [facility] Medication Adherence Campaign. Speak with patient. HIPAA identifiers verified and verbal consent received.  Edward [name] reports that he takes his rosuvastatin as directed. States that he stops rousuvastatin for 2 days at a time when he is having back pain related to spinal stenosis. Reports that stopping the statin helps to improve his symptoms. Reports that his PCP is aware that he does this for pain control. Counsel patient on the medication and the importance of medication adherence when possible. Patient verbalizes understanding. Patient denies any medication questions/concerns at this time.  Edward [name], PharmD, [place] [facility] [phone]

## 2017-07-20 ENCOUNTER — Other Ambulatory Visit: Payer: Self-pay | Admitting: Internal Medicine

## 2017-07-22 MED ORDER — ROSUVASTATIN CALCIUM 20 MG PO TABS: 20.0000 mg | ORAL_TABLET | Freq: Every day | ORAL | 3 refills | Status: DC

## 2017-07-22 NOTE — Addendum Note (Signed)
Addended by: Merlene Laughter on: 07/22/2017 05:58 PM   Modules accepted: Orders

## 2017-07-26 ENCOUNTER — Other Ambulatory Visit: Payer: Self-pay | Admitting: Internal Medicine

## 2017-07-28 NOTE — Telephone Encounter (Signed)
REFILL 

## 2017-08-04 ENCOUNTER — Telehealth: Payer: Self-pay | Admitting: *Deleted

## 2017-08-04 NOTE — Telephone Encounter (Signed)
   [facility] Pre-operative Risk Assessment    Request for surgical clearance:  1. What type of surgery is being performed? Release A-1 Pulley Right Ring Finger   2. When is this surgery scheduled? 08/15/17   3. Are there any medications that need to be held prior to surgery and how long?    4. Practice name and name of physician performing surgery? Dr. Charlotte Crumb, [facility]   5. What is your office phone and fax number? Fax: [phone]   6. Anesthesia type (None, local, MAC, general) ? Local   Edward [name] 08/04/2017, 9:59 AM  _________________________________________________________________   (provider comments below)

## 2017-08-07 NOTE — Telephone Encounter (Signed)
   Primary Cardiologist: Dr. Debara Pickett  Chart reviewed as part of pre-operative protocol coverage. Patient was contacted 08/07/2017 in reference to pre-operative risk assessment for pending surgery as outlined below.  Edward [name] was last seen on 04/15/2017 by Dr. Debara Pickett.  Since that day, Edward [name] has done well and denies any recent chest pain or dyspnea on exertion.  Therefore, based on ACC/AHA guidelines, the patient would be at acceptable risk for the planned procedure without further cardiovascular testing. Procedure is low-risk as he will receive local anesthesia.   I will route this recommendation to the requesting party via Epic fax function and remove from pre-op pool.  Please call with questions.  Erma Heritage, PA-C 08/07/2017, 2:40 PM

## 2018-04-02 ENCOUNTER — Other Ambulatory Visit: Payer: Self-pay | Admitting: Internal Medicine

## 2018-05-05 ENCOUNTER — Ambulatory Visit (INDEPENDENT_AMBULATORY_CARE_PROVIDER_SITE_OTHER): Payer: Medicare Other | Admitting: Internal Medicine

## 2018-05-05 ENCOUNTER — Encounter: Payer: Self-pay | Admitting: Internal Medicine

## 2018-05-05 VITALS — BP 148/86 | HR 60 | Ht 75.0 in | Wt 237.2 lb

## 2018-05-05 DIAGNOSIS — I480 Paroxysmal atrial fibrillation: Secondary | ICD-10-CM | POA: Diagnosis not present

## 2018-05-05 DIAGNOSIS — G4733 Obstructive sleep apnea (adult) (pediatric): Secondary | ICD-10-CM | POA: Diagnosis not present

## 2018-05-05 DIAGNOSIS — Z9989 Dependence on other enabling machines and devices: Secondary | ICD-10-CM | POA: Diagnosis not present

## 2018-05-05 DIAGNOSIS — I447 Left bundle-branch block, unspecified: Secondary | ICD-10-CM

## 2018-05-05 NOTE — Progress Notes (Signed)
OFFICE NOTE  Chief Complaint:  Doing well  Primary Care Physician: [place]  HPI:  Edward [name] is a pleasant 72 year old retired [place] who has a history of sick sinus syndrome and paroxysmal atrial fibrillation in the past. He underwent complex ablation by Dr. Ola Spurr at [facility] in 2009. Subsequently he had a isthmus ablation for atrial flutter. He also has obstructive sleep apnea, BPH, hypertension, dyslipidemia and a former smoking history. Recently he suffered a fracture of the right ankle and has had subsequent swelling and pain as well as warmth of the right ankle and calf. He also has a known left bundle branch block and he underwent stress testing in 2013 which was negative for ischemia. He hasn't had an echocardiogram at that time which showed an EF greater than 55% with moderately dilated atria and mild aortic regurgitation.  At his last office visit he had some lower extremity swelling with warmth and signs of congestion. I ordered an ultrasound in the office to rule out DVT which was negative. He still reports intermittent swelling and has had some improvement with wearing stockings but is not wearing them currently due to the hot weather.  Edward [name] returns today for follow-up. Overall he is doing fairly well. He denies any recurrent palpitations or atrial fibrillation. His CHADSVASC score is 1 and he remains on aspirin. He's been struggling with prostate enlargement is followed by Dr. Roni Bread. He's been taking Crestor with some mild memory impairment which he says is better if he misses a dose. Overall is cause a marked improvement in his cholesterol which is reduced by about 50%. He's due for repeat cholesterol test today. Overall blood pressure control is been fairly good although it is a little bit labile. He still has some right leg swelling.  03/11/2016  Edward [name] returns today for follow-up. He denies any chest pain or  worsening shortness of breath. He recalled a recent trip to [place] where he did hiking of 6-10 miles in over 100 temperature is without any significant problems. Blood pressure however is elevated at 150/90 today. Recheck was essentially unchanged. He occasionally does not take his diuretics when he is going to be outside in hot weather. He did take all this medicine today.  04/15/2017  Edward [name] returns today for annual follow-up. He recently established with a primary care provider. He had an extensive lab panel performed by private company. This indicated an elevated hemoglobin A1c of close to 8. His fasting blood sugar subsequently was improved however he made significant dietary changes. Nevertheless he is on low-dose metformin. He reports compliance with CPAP. His A. fib is not bothersome and is not recently had any episodes - he has a history of ablation in 2009 and has had no recurrence. He is on aspirin for this. Blood pressure is well-controlled today as a recheck blood pressure came down to 128/82. He denies any chest pain or worsening shortness of breath. He reports some concern about abdominal aneurysm. He reports his father had one and that died from it.  2018/05/26  Edward [name] is seen today in follow-up.  Overall he is done well and denies any new chest pain or worsening shortness of breath.  Fortunately he had a screening AAA ultrasound which was negative.  He denies any recurrent A. fib episodes.  EKG shows sinus rhythm today with some PACs and PVCs.  He has persistent left bundle branch block.  Blood pressure was initially elevated however  did come down and recheck.    PMHx:  Past Medical History:  Diagnosis Date  . Atrial fibrillation ([place])    PAF; ablation in 2009  . Back pain   . BPH (benign prostatic hyperplasia)   . History of nuclear stress test 11/2011   lexiscan; mild ischemia in apical inferior & apical lateral regions  . Hyperlipidemia   . Hypertension   . Knee  pain    bilat arthritis  . OSA (obstructive sleep apnea)    CPAP; sleep study 06/2007 - AHI during total sleep 25.95/hr and during REM 32.57/hr (moderate sleep apnea); postural component w/severe sleep apnea during supine (AHI 76.36/hr)  . Ruptured lumbar disc   . SSS (sick sinus syndrome) [facility])     Past Surgical History:  Procedure Laterality Date  . ATRIAL FIBRILLATION ABLATION  2009   [facility] - isthmius tricuspid abaltion w/no evidence of ABNRT  . BACK SURGERY  1981  . COLONOSCOPY    . KNEE SURGERY Bilateral [place]   3 surg on right/ 1 surg on left  . NOSE SURGERY     couple surg  . TRANSTHORACIC ECHOCARDIOGRAM  04/2012   EF=>55%; mod conc LVH; LA mod dilated; mild MR; trace TR; AV mildly sclerotic & mild regurg; aortic root sclerosis/calcif    FAMHx:  Family History  Adopted: Yes    SOCHx:   reports that he quit smoking about 50 years ago. His smoking use included pipe. He quit after 35.00 years of use. He has never used smokeless tobacco. He reports that he drinks alcohol. He reports that he does not use drugs.  ALLERGIES:  Allergies  Allergen Reactions  . Beta Adrenergic Blockers   . Betapace [Sotalol Hcl]     ROS: Pertinent items noted in HPI and remainder of comprehensive ROS otherwise negative.  HOME MEDS: Current Outpatient Medications  Medication Sig Dispense Refill  . amLODipine (NORVASC) 5 MG tablet TAKE 1 TABLET BY MOUTH DAILY 180 tablet 3  . aspirin EC 81 MG tablet Take 81 mg by mouth daily.    Marland Kitchen CIALIS 20 MG tablet Take 20 mg by mouth daily as needed.    . Coenzyme Q10 (CO Q 10) 100 MG CAPS Take 200 mg by mouth daily.    . fish oil-omega-3 fatty acids 1000 MG capsule Take 2 g by mouth daily.    . Ibuprofen (ADVIL PO) Take by mouth as needed.    . irbesartan (AVAPRO) 300 MG tablet TAKE 1 TABLET(300 MG) BY MOUTH AT BEDTIME 90 tablet 3  . Magnesium 400 MG CAPS Take 200 mg by mouth 2 (two) times daily.     . metFORMIN (GLUCOPHAGE) 500 MG tablet  Take 1 tablet by mouth daily.  3  . Multiple Vitamin (MULTIVITAMIN) tablet Take 1 tablet by mouth daily.    . ranitidine (ZANTAC) 150 MG tablet Take 150 mg by mouth every morning.     . rosuvastatin (CRESTOR) 20 MG tablet TAKE 1 TABLET BY MOUTH EVERY DAY 90 tablet 0  . TEKTURNA 300 MG tablet TAKE 1 TABLET BY MOUTH EVERY DAY 90 tablet 0  . triamterene-hydrochlorothiazide (MAXZIDE-25) 37.5-25 MG tablet TAKE 1 TABLET BY MOUTH EVERY DAY 30 tablet 6   Current Facility-Administered Medications  Medication Dose Route Frequency Provider Last Rate Last Dose  . 0.9 %  sodium chloride infusion  500 mL Intravenous Continuous Sable Feil, MD        LABS/IMAGING: No results found for this or any previous  visit (from the past 48 hour(s)). No results found.  VITALS: BP (!) 148/86   Pulse 60   Ht 6\' 3"  (1.905 m)   Wt 237 lb 3.2 oz (107.6 kg)   BMI 29.65 kg/m   EXAM: General appearance: alert and no distress Neck: no carotid bruit and no JVD Lungs: clear to auscultation bilaterally Heart: regular rate and rhythm, S1, S2 normal, no murmur, click, rub or gallop Abdomen: soft, non-tender; bowel sounds normal; no masses,  no organomegaly and No pulsatile abdominal mass Extremities: no significant edema Pulses: 2+ and symmetric Skin: no skin discoloration Neurologic: Motor: grossly normal Psych: Mildly anxious  EKG: Sinus rhythm with PVCs and PACs, LBBB at 60-personally reviewed  ASSESSMENT: 1. Left bundle branch block 2. History of sick sinus syndrome 3. Paroxysmal atrial fibrillation status post AF-ablation and a-flutter ablation (no recurrence since 2009-on aspirin) 4. HTN 5. Dyslipidemia 6. Hypertension 7. Family history of AAA-negative screening ultrasound (04/2017)  PLAN: 1.   Edward [name] continues to do well without any recurrent A. fib or flutter symptoms.  Left bundle branch block is stable.  He denies any worsening shortness of breath or chest pain.  Pixie Casino, MD,  [facility], [place] Director of the Advanced Lipid Disorders &  Cardiovascular Risk Reduction Clinic Diplomate of the American Board of Clinical Lipidology Attending Cardiologist  Direct Dial: [phone]  Fax: [phone]  Website:  [email or web address] 05/05/2018, 9:47 AM

## 2018-05-05 NOTE — Patient Instructions (Signed)
Your physician wants you to follow-up in: 1 year with Dr. Debara Pickett. You will receive a reminder notice via [place]. Please call our office to schedule an appointment once this notice is received.

## 2018-05-06 ENCOUNTER — Encounter: Payer: Self-pay | Admitting: Internal Medicine

## 2018-06-21 ENCOUNTER — Other Ambulatory Visit: Payer: Self-pay | Admitting: Internal Medicine

## 2018-07-01 ENCOUNTER — Other Ambulatory Visit: Payer: Self-pay | Admitting: Internal Medicine

## 2018-07-01 NOTE — Telephone Encounter (Signed)
Rx has been sent to the pharmacy electronically. ° °

## 2018-09-15 ENCOUNTER — Other Ambulatory Visit: Payer: Self-pay | Admitting: Internal Medicine

## 2018-11-28 ENCOUNTER — Other Ambulatory Visit: Payer: Self-pay | Admitting: Internal Medicine

## 2018-11-30 NOTE — Telephone Encounter (Signed)
Amlodipine 5 mg refilled. 

## 2019-02-25 ENCOUNTER — Other Ambulatory Visit: Payer: Self-pay | Admitting: Internal Medicine

## 2019-02-26 ENCOUNTER — Other Ambulatory Visit: Payer: Self-pay | Admitting: Internal Medicine

## 2019-02-26 MED ORDER — ROSUVASTATIN CALCIUM 20 MG PO TABS: 20.0000 mg | ORAL_TABLET | Freq: Every day | ORAL | 0 refills | Status: DC

## 2019-02-26 NOTE — Addendum Note (Signed)
Addended by: Fidel Levy on: 02/26/2019 12:06 PM   Modules accepted: Orders

## 2019-02-26 NOTE — Telephone Encounter (Addendum)
Spoke with patient about medications He is taking irbesartan and tekturna and has been for years, per report. Per chart review, at least since 2014  Per MD, he should not be on both medications and MD recommends to STOP tekturna and continue irbesartan and monitor BP. Patient advised of this. He was asked to check BP twice daily for 7-10 days and notify office if BP trends up.   He has cholesterol checked by PCP, last was a few months ago, and he reports cholesterol was good  crestor has been refilled

## 2019-02-27 ENCOUNTER — Other Ambulatory Visit: Payer: Self-pay | Admitting: Internal Medicine

## 2019-03-29 ENCOUNTER — Telehealth: Payer: Self-pay | Admitting: Internal Medicine

## 2019-03-29 NOTE — Telephone Encounter (Signed)
Spoke with pt, about 2 months ago he was told to stop the [place] which he has. Since stopping that med he has noticed and elevated heart rate. He reports SOB with exertion which is new for him. He has a hx of atrial fib ablation in 2009 and wonders if that has returned, his heart rate is running 50-70 bpm but will elevated with exertion. Follow up appointment scheduled this week with dr Debara Pickett.

## 2019-03-29 NOTE — Telephone Encounter (Signed)
New Message   Pt c/o BP issue:  1. What are your last 5 BP readings? 144/82 2. Are you having any other symptoms (ex. Dizziness, headache, blurred vision, passed out)? Irregular heartbeat 3. What is your medication issue? None   Patient states that there was some changes made with his BP medication and he was to report any changes he notice. Please call

## 2019-04-01 ENCOUNTER — Encounter: Payer: Self-pay | Admitting: Internal Medicine

## 2019-04-01 ENCOUNTER — Other Ambulatory Visit: Payer: Self-pay

## 2019-04-01 ENCOUNTER — Ambulatory Visit (INDEPENDENT_AMBULATORY_CARE_PROVIDER_SITE_OTHER): Payer: Medicare Other | Admitting: Internal Medicine

## 2019-04-01 VITALS — BP 138/88 | HR 63 | Temp 97.9°F | Ht 75.0 in | Wt 232.0 lb

## 2019-04-01 DIAGNOSIS — I480 Paroxysmal atrial fibrillation: Secondary | ICD-10-CM

## 2019-04-01 DIAGNOSIS — I447 Left bundle-branch block, unspecified: Secondary | ICD-10-CM | POA: Diagnosis not present

## 2019-04-01 DIAGNOSIS — R0789 Other chest pain: Secondary | ICD-10-CM | POA: Diagnosis not present

## 2019-04-01 DIAGNOSIS — R0602 Shortness of breath: Secondary | ICD-10-CM

## 2019-04-01 DIAGNOSIS — R079 Chest pain, unspecified: Secondary | ICD-10-CM

## 2019-04-01 NOTE — Patient Instructions (Signed)
Medication Instructions:  Your physician recommends that you continue on your current medications as directed. Please refer to the Current Medication list given to you today.  If you need a refill on your cardiac medications before your next appointment, please call your pharmacy.   Testing/Procedures: Echo & Lexiscan Myoview (stress test) @ [address] - 3rd Floor  Lexiscan Myocardial Perfusion Imaging Study  Please arrive 15 minutes prior to your appointment time for registration and insurance purposes.   The test will take approximately 3 to 4 hours to complete; you may bring reading material.  If someone comes with you to your appointment, they will need to remain in the main lobby due to limited space in the testing area.   How to prepare for your Myocardial Perfusion Test:  Do not eat or drink 3 hours prior to your test, except you may have water.  Do not consume products containing caffeine (regular or decaffeinated) 12 hours prior to your test. (ex: coffee, chocolate, sodas, tea).  Do wear comfortable clothes (no dresses or overalls) and walking shoes, tennis shoes preferred (No heels or open toe shoes are allowed).  Do NOT wear cologne, perfume, aftershave, or lotions (deodorant is allowed).  If you use an inhaler, use it the AM of your test and bring it with you.   If you use a nebulizer, use it the AM of your test.   If these instructions are not followed, your test will have to be rescheduled.   Follow-Up: as scheduled with Dr. Debara Pickett

## 2019-04-02 ENCOUNTER — Encounter: Payer: Self-pay | Admitting: Internal Medicine

## 2019-04-02 NOTE — Progress Notes (Signed)
OFFICE NOTE  Chief Complaint:  Heart racing  Primary Care Physician: [facility]  HPI:  Edward [name] is a pleasant 73 year old retired Edward [name] who has a history of sick sinus syndrome and paroxysmal atrial fibrillation in the past. He underwent complex ablation by Dr. Ola Spurr at [facility] in 2009. Subsequently he had a isthmus ablation for atrial flutter. He also has obstructive sleep apnea, BPH, hypertension, dyslipidemia and a former smoking history. Recently he suffered a fracture of the right ankle and has had subsequent swelling and pain as well as warmth of the right ankle and calf. He also has a known left bundle branch block and he underwent stress testing in 2013 which was negative for ischemia. He hasn't had an echocardiogram at that time which showed an EF greater than 55% with moderately dilated atria and mild aortic regurgitation.  At his last office visit he had some lower extremity swelling with warmth and signs of congestion. I ordered an ultrasound in the office to rule out DVT which was negative. He still reports intermittent swelling and has had some improvement with wearing stockings but is not wearing them currently due to the hot weather.  Edward [name] returns today for follow-up. Overall he is doing fairly well. He denies any recurrent palpitations or atrial fibrillation. His CHADSVASC score is 1 and he remains on aspirin. He's been struggling with prostate enlargement is followed by Dr. Roni Bread. He's been taking Crestor with some mild memory impairment which he says is better if he misses a dose. Overall is cause a marked improvement in his cholesterol which is reduced by about 50%. He's due for repeat cholesterol test today. Overall blood pressure control is been fairly good although it is a little bit labile. He still has some right leg swelling.  03/11/2016  Edward [name] returns today for follow-up. He denies any chest pain or  worsening shortness of breath. He recalled a recent trip to [place] where he did hiking of 6-10 miles in over 100 temperature is without any significant problems. Blood pressure however is elevated at 150/90 today. Recheck was essentially unchanged. He occasionally does not take his diuretics when he is going to be outside in hot weather. He did take all this medicine today.  04/15/2017  Edward [name] returns today for annual follow-up. He recently established with a primary care provider. He had an extensive lab panel performed by private company. This indicated an elevated hemoglobin A1c of close to 8. His fasting blood sugar subsequently was improved however he made significant dietary changes. Nevertheless he is on low-dose metformin. He reports compliance with CPAP. His A. fib is not bothersome and is not recently had any episodes - he has a history of ablation in 2009 and has had no recurrence. He is on aspirin for this. Blood pressure is well-controlled today as a recheck blood pressure came down to 128/82. He denies any chest pain or worsening shortness of breath. He reports some concern about abdominal aneurysm. He reports his father had one and that died from it.  2018-05-19  Edward [name] is seen today in follow-up.  Overall he is done well and denies any new chest pain or worsening shortness of breath.  Fortunately he had a screening AAA ultrasound which was negative.  He denies any recurrent A. fib episodes.  EKG shows sinus rhythm today with some PACs and PVCs.  He has persistent left bundle branch block.  Blood pressure was initially elevated however  did come down and recheck.    04/01/2019  Edward [name] is seen today in recurrent follow-up.  He was scheduled to be seen in October however had recent episodes where he noted his heart was racing with exertion.  Particularly if he was walking up hills or on inclines he noted that his heart rate would shoot up.  He does not note any increase in  heart rate with other episodes.  He also has noted his blood pressure running higher than normal.  EKG shows sinus rhythm with a left bundle branch block and QRS duration of 164 ms.  Heart rate is 63.  Denies any significant shortness of breath.  PMHx:  Past Medical History:  Diagnosis Date  . Atrial fibrillation ([place])    PAF; ablation in 2009  . Back pain   . BPH (benign prostatic hyperplasia)   . History of nuclear stress test 11/2011   lexiscan; mild ischemia in apical inferior & apical lateral regions  . Hyperlipidemia   . Hypertension   . Knee pain    bilat arthritis  . OSA (obstructive sleep apnea)    CPAP; sleep study 06/2007 - AHI during total sleep 25.95/hr and during REM 32.57/hr (moderate sleep apnea); postural component w/severe sleep apnea during supine (AHI 76.36/hr)  . Ruptured lumbar disc   . SSS (sick sinus syndrome) [facility])     Past Surgical History:  Procedure Laterality Date  . ATRIAL FIBRILLATION ABLATION  2009   [facility] - isthmius tricuspid abaltion w/no evidence of ABNRT  . BACK SURGERY  1981  . COLONOSCOPY    . KNEE SURGERY Bilateral [place]   3 surg on right/ 1 surg on left  . NOSE SURGERY     couple surg  . TRANSTHORACIC ECHOCARDIOGRAM  04/2012   EF=>55%; mod conc LVH; LA mod dilated; mild MR; trace TR; AV mildly sclerotic & mild regurg; aortic root sclerosis/calcif    FAMHx:  Family History  Adopted: Yes    SOCHx:   reports that he quit smoking about 51 years ago. His smoking use included pipe. He quit after 35.00 years of use. He has never used smokeless tobacco. He reports current alcohol use. He reports that he does not use drugs.  ALLERGIES:  Allergies  Allergen Reactions  . Beta Adrenergic Blockers   . Betapace [Sotalol Hcl]     ROS: Pertinent items noted in HPI and remainder of comprehensive ROS otherwise negative.  HOME MEDS: Current Outpatient Medications  Medication Sig Dispense Refill  . amLODipine (NORVASC) 5 MG tablet  TAKE 1 TABLET BY MOUTH DAILY 90 tablet 1  . aspirin EC 81 MG tablet Take 81 mg by mouth daily.    Marland Kitchen CIALIS 20 MG tablet Take 20 mg by mouth daily as needed.    . Coenzyme Q10 (CO Q 10) 100 MG CAPS Take 200 mg by mouth daily.    . fish oil-omega-3 fatty acids 1000 MG capsule Take 2 g by mouth daily.    . Ibuprofen (ADVIL PO) Take by mouth as needed.    . irbesartan (AVAPRO) 300 MG tablet TAKE 1 TABLET BY MOUTH DAILY. OFFICE VISIT NEEDED 90 tablet 1  . Magnesium 400 MG CAPS Take 200 mg by mouth 2 (two) times daily.     . metFORMIN (GLUCOPHAGE) 500 MG tablet Take 1 tablet by mouth daily.  3  . Multiple Vitamin (MULTIVITAMIN) tablet Take 1 tablet by mouth daily.    . rosuvastatin (CRESTOR) 20 MG tablet Take  1 tablet (20 mg total) by mouth daily. 90 tablet 0   Current Facility-Administered Medications  Medication Dose Route Frequency Provider Last Rate Last Dose  . 0.9 %  sodium chloride infusion  500 mL Intravenous Continuous Sable Feil, MD        LABS/IMAGING: No results found for this or any previous visit (from the past 48 hour(s)). No results found.  VITALS: BP 138/88   Pulse 63   Temp 97.9 F (36.6 C) (Temporal)   Ht 6\' 3"  (1.905 m)   Wt 232 lb (105.2 kg)   BMI 29.00 kg/m   EXAM: General appearance: alert and no distress Neck: no carotid bruit and no JVD Lungs: clear to auscultation bilaterally Heart: regular rate and rhythm, S1, S2 normal, no murmur, click, rub or gallop Abdomen: soft, non-tender; bowel sounds normal; no masses,  no organomegaly and No pulsatile abdominal mass Extremities: no significant edema Pulses: 2+ and symmetric Skin: no skin discoloration Neurologic: Motor: grossly normal Psych: Mildly anxious  EKG: Normal sinus rhythm with sinus arrhythmia at 63, left bundle branch block-personally reviewed   ASSESSMENT: 1. Tachycardia 2. LBBB 3. History of sick sinus syndrome 4. Paroxysmal atrial fibrillation status post AF-ablation and a-flutter  ablation (no recurrence since 2009-on aspirin) 5. HTN 6. Dyslipidemia 7. Hypertension 8. Family history of AAA-negative screening ultrasound (04/2017)  PLAN: 1.   Mr. Jurgensen is describing some exercise-induced tachycardia and is concerned about arrhythmia perhaps coronary disease.  He is not had any coronary testing in some time.  In addition he has a longstanding left bundle branch block and could have some related cardiomyopathy.  We will plan an echocardiogram and Lexiscan Myoview stress test to further evaluate.  Plan follow-up with me afterwards.  Pixie Casino, MD, [facility], [place] Director of the Advanced Lipid Disorders &  Cardiovascular Risk Reduction Clinic Diplomate of the American Board of Clinical Lipidology Attending Cardiologist  Direct Dial: [phone]  Fax: [phone]  Website:  [email or web address]  04/02/2019, 11:35 AM

## 2019-04-07 ENCOUNTER — Telehealth (HOSPITAL_COMMUNITY): Payer: Self-pay | Admitting: *Deleted

## 2019-04-07 NOTE — Telephone Encounter (Signed)
Patient given detailed instructions per Myocardial Perfusion Study Information Sheet for the test on 04/09/19 at 9:45. Patient notified to arrive 15 minutes early and that it is imperative to arrive on time for appointment to keep from having the test rescheduled.  If you need to cancel or reschedule your appointment, please call the office within 24 hours of your appointment. . Patient verbalized understanding.Edward [name]

## 2019-04-08 ENCOUNTER — Other Ambulatory Visit: Payer: Self-pay | Admitting: Internal Medicine

## 2019-04-09 ENCOUNTER — Other Ambulatory Visit: Payer: Self-pay

## 2019-04-09 ENCOUNTER — Ambulatory Visit (HOSPITAL_COMMUNITY): Payer: Medicare Other | Attending: Cardiology

## 2019-04-09 ENCOUNTER — Ambulatory Visit (HOSPITAL_BASED_OUTPATIENT_CLINIC_OR_DEPARTMENT_OTHER): Payer: Medicare Other

## 2019-04-09 DIAGNOSIS — R0789 Other chest pain: Secondary | ICD-10-CM | POA: Diagnosis not present

## 2019-04-09 DIAGNOSIS — I447 Left bundle-branch block, unspecified: Secondary | ICD-10-CM | POA: Diagnosis not present

## 2019-04-09 DIAGNOSIS — I480 Paroxysmal atrial fibrillation: Secondary | ICD-10-CM | POA: Insufficient documentation

## 2019-04-09 DIAGNOSIS — R0602 Shortness of breath: Secondary | ICD-10-CM | POA: Diagnosis not present

## 2019-04-09 DIAGNOSIS — R079 Chest pain, unspecified: Secondary | ICD-10-CM

## 2019-04-09 LAB — MYOCARDIAL PERFUSION IMAGING
LV dias vol: 313 mL (ref 62–150)
LV sys vol: 215 mL
Peak HR: 91 {beats}/min
Rest HR: 72 {beats}/min
SDS: 5
SRS: 2
SSS: 7
TID: 1

## 2019-04-09 MED ORDER — REGADENOSON 0.4 MG/5ML IV SOLN
0.4000 mg | Freq: Once | INTRAVENOUS | Status: AC
  Administered 2019-04-09: 0.4 mg via INTRAVENOUS

## 2019-04-09 MED ORDER — TECHNETIUM TC 99M TETROFOSMIN IV KIT
10.6000 | PACK | Freq: Once | INTRAVENOUS | Status: AC | PRN
  Administered 2019-04-09: 10.6 via INTRAVENOUS
  Filled 2019-04-09: qty 11

## 2019-04-09 MED ORDER — TECHNETIUM TC 99M TETROFOSMIN IV KIT
31.4000 | PACK | Freq: Once | INTRAVENOUS | Status: AC | PRN
  Administered 2019-04-09: 31.4 via INTRAVENOUS
  Filled 2019-04-09: qty 32

## 2019-04-12 ENCOUNTER — Telehealth: Payer: Self-pay | Admitting: Internal Medicine

## 2019-04-12 DIAGNOSIS — I77810 Thoracic aortic ectasia: Secondary | ICD-10-CM

## 2019-04-12 DIAGNOSIS — I712 Thoracic aortic aneurysm, without rupture, unspecified: Secondary | ICD-10-CM

## 2019-04-12 DIAGNOSIS — Z79899 Other long term (current) drug therapy: Secondary | ICD-10-CM

## 2019-04-12 MED ORDER — SACUBITRIL-VALSARTAN 49-51 MG PO TABS: 1.0000 | ORAL_TABLET | Freq: Two times a day (BID) | ORAL | 11 refills | Status: DC

## 2019-04-12 NOTE — Telephone Encounter (Signed)
New Message    Patient is calling in about the results of his test on Friday. Please give patient a call back to discuss.

## 2019-04-12 NOTE — Telephone Encounter (Signed)
Med(s) changed. BMET ordered. CT angio chest/aorta for 6 months ordered per verbal order from MD

## 2019-04-12 NOTE — Telephone Encounter (Signed)
Patient called with echo & myoview results. He has additional questions  On echo - AFib was noted - he wants to know what he should do about that, as he has continued SOB  On myoview - report indicates a possible previous infarct/heart attack - please elaborate per his request  He has reviewed both studies in MyChart

## 2019-04-12 NOTE — Telephone Encounter (Signed)
Called patient to claify tests - please stop irbesartan and switch to Entresto 49/51 mg BID - check BMET in 1 week. Keep follow-up in October.  Dr. Lemmie Evens

## 2019-05-13 ENCOUNTER — Ambulatory Visit (INDEPENDENT_AMBULATORY_CARE_PROVIDER_SITE_OTHER): Payer: Medicare Other | Admitting: Internal Medicine

## 2019-05-13 ENCOUNTER — Other Ambulatory Visit: Payer: Self-pay

## 2019-05-13 ENCOUNTER — Encounter: Payer: Self-pay | Admitting: Internal Medicine

## 2019-05-13 VITALS — BP 102/76 | HR 74 | Temp 97.6°F | Ht 75.0 in | Wt 224.0 lb

## 2019-05-13 DIAGNOSIS — I519 Heart disease, unspecified: Secondary | ICD-10-CM | POA: Diagnosis not present

## 2019-05-13 DIAGNOSIS — Z01812 Encounter for preprocedural laboratory examination: Secondary | ICD-10-CM

## 2019-05-13 DIAGNOSIS — I447 Left bundle-branch block, unspecified: Secondary | ICD-10-CM | POA: Diagnosis not present

## 2019-05-13 DIAGNOSIS — I5021 Acute systolic (congestive) heart failure: Secondary | ICD-10-CM | POA: Diagnosis not present

## 2019-05-13 DIAGNOSIS — I428 Other cardiomyopathies: Secondary | ICD-10-CM

## 2019-05-13 DIAGNOSIS — I480 Paroxysmal atrial fibrillation: Secondary | ICD-10-CM

## 2019-05-13 NOTE — Patient Instructions (Signed)
Medication Instructions:  Your physician recommends that you continue on your current medications as directed. Please refer to the Current Medication list given to you today.  If you need a refill on your cardiac medications before your next appointment, please call your pharmacy.   Testing/Procedures: CT test to be scheduled ~ Feb 2021 to assess aorta  -- you need to complete lab test (BMET) prior to this test  Echo to be scheduled ~ April 2021 to assess heart function  Follow-Up: At [facility], you and your health needs are our priority.  As part of our continuing mission to provide you with exceptional heart care, we have created designated Provider Care Teams.  These Care Teams include your primary Cardiologist (physician) and Advanced Practice Providers (APPs -  Physician Assistants and Nurse Practitioners) who all work together to provide you with the care you need, when you need it. You will need a follow up appointment in 6 months.  Please call our office 2 months in advance to schedule this appointment.  You may see Dr. Debara Pickett or one of the following Advanced Practice Providers on your designated Care Team: Almyra Deforest, [place] . Fabian Sharp, PA-C

## 2019-05-13 NOTE — Progress Notes (Signed)
OFFICE NOTE  Chief Complaint:  Follow-up echo  Primary Care Physician: [place]  HPI:  Edward [name] is a pleasant 73 year old retired [employer] who has a history of sick sinus syndrome and paroxysmal atrial fibrillation in the past. He underwent complex ablation by Dr. Ola Spurr at [facility] in 2009. Subsequently he had a isthmus ablation for atrial flutter. He also has obstructive sleep apnea, BPH, hypertension, dyslipidemia and a former smoking history. Recently he suffered a fracture of the right ankle and has had subsequent swelling and pain as well as warmth of the right ankle and calf. He also has a known left bundle branch block and he underwent stress testing in 2013 which was negative for ischemia. He hasn't had an echocardiogram at that time which showed an EF greater than 55% with moderately dilated atria and mild aortic regurgitation.  At his last office visit he had some lower extremity swelling with warmth and signs of congestion. I ordered an ultrasound in the office to rule out DVT which was negative. He still reports intermittent swelling and has had some improvement with wearing stockings but is not wearing them currently due to the hot weather.  Edward [name] returns today for follow-up. Overall he is doing fairly well. He denies any recurrent palpitations or atrial fibrillation. His CHADSVASC score is 1 and he remains on aspirin. He's been struggling with prostate enlargement is followed by Dr. Roni Bread. He's been taking Crestor with some mild memory impairment which he says is better if he misses a dose. Overall is cause a marked improvement in his cholesterol which is reduced by about 50%. He's due for repeat cholesterol test today. Overall blood pressure control is been fairly good although it is a little bit labile. He still has some right leg swelling.  03/11/2016  Edward [name] returns today for follow-up. He denies any chest pain or  worsening shortness of breath. He recalled a recent trip to [place] where he did hiking of 6-10 miles in over 100 temperature is without any significant problems. Blood pressure however is elevated at 150/90 today. Recheck was essentially unchanged. He occasionally does not take his diuretics when he is going to be outside in hot weather. He did take all this medicine today.  04/15/2017  Edward [name] returns today for annual follow-up. He recently established with a primary care provider. He had an extensive lab panel performed by private company. This indicated an elevated hemoglobin A1c of close to 8. His fasting blood sugar subsequently was improved however he made significant dietary changes. Nevertheless he is on low-dose metformin. He reports compliance with CPAP. His A. fib is not bothersome and is not recently had any episodes - he has a history of ablation in 2009 and has had no recurrence. He is on aspirin for this. Blood pressure is well-controlled today as a recheck blood pressure came down to 128/82. He denies any chest pain or worsening shortness of breath. He reports some concern about abdominal aneurysm. He reports his father had one and that died from it.  06/04/2018  Edward [name] is seen today in follow-up.  Overall he is done well and denies any new chest pain or worsening shortness of breath.  Fortunately he had a screening AAA ultrasound which was negative.  He denies any recurrent A. fib episodes.  EKG shows sinus rhythm today with some PACs and PVCs.  He has persistent left bundle branch block.  Blood pressure was initially elevated however  did come down and recheck.    04/01/2019  Edward [name] is seen today in recurrent follow-up.  He was scheduled to be seen in October however had recent episodes where he noted his heart was racing with exertion.  Particularly if he was walking up hills or on inclines he noted that his heart rate would shoot up.  He does not note any increase in  heart rate with other episodes.  He also has noted his blood pressure running higher than normal.  EKG shows sinus rhythm with a left bundle branch block and QRS duration of 164 ms.  Heart rate is 63.  Denies any significant shortness of breath.  05/13/2019  Edward [name] returns today for follow-up.  He underwent an echo which demonstrated a newly reduced LVEF of 30 to 35%.  Only, his ascending aorta was dilated enlarged to 4.5 cm.  A nuclear stress test was also performed which showed an EF of 31% and a fixed defect in the basal inferior mid inferior and apical inferior areas either consistent with infarction or possibly diaphragmatic attenuation.  There was no reversible ischemia.  EKG does not suggest inferior infarct rather has left bundle branch block morphology.  This findings could also be associated with a left bundle branch block artifact.  I suspect this is a nonischemic cardiomyopathy.  Based on these findings switched his ARB medication over to an Entresto 49/51 mg twice daily.  He initially had difficulty tolerating that and was also on amlodipine.  Apparently his blood pressure got very low with it and then he discontinued amlodipine himself.  Since then he is felt much better.  Blood pressure today is 102/76.  He does feel like he is breathing better in fact can walk up hills without being short of breath and generally endorses NYHA class I-II symptoms at this point.  PMHx:  Past Medical History:  Diagnosis Date  . Atrial fibrillation ([place])    PAF; ablation in 2009  . Back pain   . BPH (benign prostatic hyperplasia)   . History of nuclear stress test 11/2011   lexiscan; mild ischemia in apical inferior & apical lateral regions  . Hyperlipidemia   . Hypertension   . Knee pain    bilat arthritis  . OSA (obstructive sleep apnea)    CPAP; sleep study 06/2007 - AHI during total sleep 25.95/hr and during REM 32.57/hr (moderate sleep apnea); postural component w/severe sleep apnea during  supine (AHI 76.36/hr)  . Ruptured lumbar disc   . SSS (sick sinus syndrome) [facility])     Past Surgical History:  Procedure Laterality Date  . ATRIAL FIBRILLATION ABLATION  2009   [facility] - isthmius tricuspid abaltion w/no evidence of ABNRT  . BACK SURGERY  1981  . COLONOSCOPY    . KNEE SURGERY Bilateral [place]   3 surg on right/ 1 surg on left  . NOSE SURGERY     couple surg  . TRANSTHORACIC ECHOCARDIOGRAM  04/2012   EF=>55%; mod conc LVH; LA mod dilated; mild MR; trace TR; AV mildly sclerotic & mild regurg; aortic root sclerosis/calcif    FAMHx:  Family History  Adopted: Yes    SOCHx:   reports that he quit smoking about 51 years ago. His smoking use included pipe. He quit after 35.00 years of use. He has never used smokeless tobacco. He reports current alcohol use. He reports that he does not use drugs.  ALLERGIES:  Allergies  Allergen Reactions  . Beta Adrenergic Blockers   .  Betapace [Sotalol Hcl]     ROS: Pertinent items noted in HPI and remainder of comprehensive ROS otherwise negative.  HOME MEDS: Current Outpatient Medications  Medication Sig Dispense Refill  . aspirin EC 81 MG tablet Take 81 mg by mouth daily.    Marland Kitchen CIALIS 20 MG tablet Take 20 mg by mouth daily as needed.    . Coenzyme Q10 (CO Q 10) 100 MG CAPS Take 200 mg by mouth daily.    . fish oil-omega-3 fatty acids 1000 MG capsule Take 2 g by mouth daily.    . Ibuprofen (ADVIL PO) Take by mouth as needed.    . Magnesium 400 MG CAPS Take 200 mg by mouth 2 (two) times daily.     . metFORMIN (GLUCOPHAGE) 500 MG tablet Take 1 tablet by mouth daily.  3  . Multiple Vitamin (MULTIVITAMIN) tablet Take 1 tablet by mouth daily.    . rosuvastatin (CRESTOR) 20 MG tablet Take 1 tablet (20 mg total) by mouth daily. 90 tablet 0  . sacubitril-valsartan (ENTRESTO) 49-51 MG Take 1 tablet by mouth 2 (two) times daily. 60 tablet 11   Current Facility-Administered Medications  Medication Dose Route Frequency Provider  Last Rate Last Dose  . 0.9 %  sodium chloride infusion  500 mL Intravenous Continuous Sable Feil, MD        LABS/IMAGING: No results found for this or any previous visit (from the past 48 hour(s)). No results found.  VITALS: BP 102/76   Pulse 74   Temp 97.6 F (36.4 C) (Temporal)   Ht 6\' 3"  (1.905 m) Comment: Simultaneous filing. User may not have seen previous data.  Wt 224 lb (101.6 kg)   SpO2 97%   BMI 28.00 kg/m   EXAM: General appearance: alert and no distress Neck: no carotid bruit and no JVD Lungs: clear to auscultation bilaterally Heart: regular rate and rhythm, S1, S2 normal, no murmur, click, rub or gallop Abdomen: soft, non-tender; bowel sounds normal; no masses,  no organomegaly and No pulsatile abdominal mass Extremities: no significant edema Pulses: 2+ and symmetric Skin: no skin discoloration Neurologic: Motor: grossly normal Psych: Mildly anxious  EKG: Sinus rhythm with PVCs at 74, LBBB with QRS D 158 ms-personally reviewed  ASSESSMENT: 1. Acute systolic congestive heart failure, LVEF 30 to 35% (03/2019), no reversible ischemia on Myoview stress testing 2. LBBB with wide QRS D of close to 160 ms 3. History of sick sinus syndrome 4. Paroxysmal atrial fibrillation status post AF-ablation and a-flutter ablation (no recurrence since 2009-on aspirin) 5. HTN 6. Dyslipidemia 7. Hypertension 8. Family history of AAA-negative screening ultrasound (04/2017)  PLAN: 1.   Mr. 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  This is significantly improved now after medication adjustment and starting on Entresto.  I suspect his cardiomyopathy may be related to longstanding left bundle branch block now with wider QRS.  He is not had any recurrent atrial fibrillation since his ablation in 2009.  While there was some mention of possible atrial fibrillation during his echo, I personally reviewed the  study and he has active "A waves".  This suggest that he was not in atrial fibrillation.  We will plan to continue his current medication and repeat an echocardiogram in about 6 months.  If his LVEF has not improved at that time, I will refer him for evaluation of possible CRT-P/D therapy.  Pixie Casino, MD, [facility], [place]  [facility] of the [facility] Lipid [facility]  [facility] Diplomate of the American Board of Clinical Lipidology Attending Cardiologist  Direct Dial: [phone]  Fax: [phone]  Website:  [email or web address] 05/13/2019, 11:51 AM

## 2019-06-01 ENCOUNTER — Other Ambulatory Visit: Payer: Self-pay

## 2019-06-01 NOTE — Telephone Encounter (Signed)
REFILL 

## 2019-07-28 ENCOUNTER — Telehealth: Payer: Self-pay | Admitting: Internal Medicine

## 2019-07-28 NOTE — Telephone Encounter (Signed)
New Message    Pt is calling and would like for the nurse to call him  He says he has questions about medication and is having some issues he needs to discuss with the nurse    Please call

## 2019-07-28 NOTE — Telephone Encounter (Signed)
LMTCB

## 2019-07-28 NOTE — Telephone Encounter (Signed)
Patient returning call.

## 2019-07-29 NOTE — Telephone Encounter (Signed)
[id number] 

## 2019-07-29 NOTE — Telephone Encounter (Signed)
If he gets worse before then, should go to the ER.  Dr. Lemmie Evens

## 2019-07-29 NOTE — Telephone Encounter (Signed)
Patient reports in last 3 weeks he has gone way downhill. His SOB is very profound. He had previously been walking a few miles a few times a week with no issues but now he can barely walk up his driveway. He is not sure if symptoms are HF related, heart rhythm related, lung related. He has no chest pain. He denies swelling. He reports he has lost weight.   He has an OV with Dr. Ola Spurr who did ablation years past on Jan 6  Offered him PA visit today @ 3pm but canno make this  Scheduled him to see Isaac Laud PA on 12/21  Routed to MD as Juluis Rainier or for any suggestions

## 2019-07-29 NOTE — Telephone Encounter (Signed)
Follow up   Please return call to patient on home phone

## 2019-07-31 NOTE — Progress Notes (Signed)
Cardiology Office Note:    Date:  08/02/2019   ID:  Edward [name], DOB February 24, 1946, MRN [id number]  PCP:  [place]  Cardiologist:  Pixie Casino, MD  Electrophysiologist:  Dr. Ola Spurr at [facility]  Referring MD: [place]   Chief Complaint: shortness of breath   History of Present Illness:    Edward [name] is a 73 y.o. male with a history of sick sinus syndrome, paroxsymal atrial fibrillation s/p ablation in 2009 with subsequent isthmus ablation for atrial flutter by Dr. Ola Spurr at [facility], hypertension, hyperlipidemia, obstructive sleep apnea on CPAP, BPH, and former tobacco use who is followed by Dr. Debara Pickett.   Patient was seen by Dr Debara Pickett in 03/2019 and reported recent episodes where he noted his heart racing with exertion, particularly if walking on an incline. Lexiscan Myoview and Echo were ordered for further evaluation. Echo showed LVEF of 30-35% with diffuse hypokinesis, mild AI, mild to moderate MR, moderately elevated RVSP, and mild dilatation of the aortic root and the ascending aorta measuring 39 mm and 45 mm respectively. Report made note that diastolic function could not be assessed due to atrial fibrillation; however, Dr. Debara Pickett personally reviewed the images and reported active "A waves" which suggests that he was not in atrial fibrillation. Myoview showed large defect consistent with prior MI but no reversible ischemia. Considered high risk due to EF of 30-44%. Dr. Debara Pickett felt findings could be associated with LBBB artifact and suspected non-ischemic cardiomyopathy. Therefore, ARB was switched to [facility] 49/51 twice daily. He initially had difficult tolerating this while on Amlodipine because his BP dropped quite low. Patient self-discontinued his Amlodipine with improvement. At follow-up visit in 05/2019 with Dr. Debara Pickett, patient reported breathing had improved with Entresto. Plan is to continue medications for 6 months and then  repeat Echo. If LVEF has not improved, will need to refer him for possible CRT-P/D therapy.  Patient called our office on 07/28/2019 reporting worsening shortness of breath over the last 3 weeks. Therefore, today's appointment was made. Patient reports worsening dyspnea on exertion. He states that he used to be able to walk 2-3 miles 3-4 times per week. However since starting Entresto, he states he can only walk about 1/2 mile before getting short of breath. He also reports sudden onset of shortness of breath while at rest that resolves quickly. Interestingly, he states he started taking Mucinex about 3-4 days ago and that seems to have helped his shortness of breath. However, he has not tried to walk a long ways since then. He also notes occasional orthopnea and PND but none in the last 3-4 days. No lower extremity edema or weight gain. No fevers or cough to suggest infectious etiology for his dyspnea. No chest pain. He does report some palpitations that he describes as feeling like his heart just "stops" or skips a beat. He does feel like he has shortness of breath with these palpitations. He occassionally has some very mild dizziness with this but denies any falls or syncope. Since starting Entresto, he also reports occasional numbness around his lips that last for a short time and then goes away. No stroke symptoms.  No abnormal bleeding in stools or urine.  Past Medical History:  Diagnosis Date  . Atrial fibrillation ([place])    PAF; ablation in 2009  . Back pain   . BPH (benign prostatic hyperplasia)   . History of nuclear stress test 11/2011   lexiscan; mild ischemia in apical inferior &  apical lateral regions  . Hyperlipidemia   . Hypertension   . Knee pain    bilat arthritis  . OSA (obstructive sleep apnea)    CPAP; sleep study 06/2007 - AHI during total sleep 25.95/hr and during REM 32.57/hr (moderate sleep apnea); postural component w/severe sleep apnea during supine (AHI 76.36/hr)  .  Ruptured lumbar disc   . SSS (sick sinus syndrome) [facility])     Past Surgical History:  Procedure Laterality Date  . ATRIAL FIBRILLATION ABLATION  2009   [facility] - isthmius tricuspid abaltion w/no evidence of ABNRT  . BACK SURGERY  1981  . COLONOSCOPY    . KNEE SURGERY Bilateral [place]   3 surg on right/ 1 surg on left  . NOSE SURGERY     couple surg  . TRANSTHORACIC ECHOCARDIOGRAM  04/2012   EF=>55%; mod conc LVH; LA mod dilated; mild MR; trace TR; AV mildly sclerotic & mild regurg; aortic root sclerosis/calcif    Current Medications: Current Meds  Medication Sig  . aspirin EC 81 MG tablet Take 81 mg by mouth daily.  . Coenzyme Q10 (CO Q 10) 100 MG CAPS Take 200 mg by mouth daily.  . fish oil-omega-3 fatty acids 1000 MG capsule Take 2 g by mouth daily.  . Ibuprofen (ADVIL PO) Take by mouth as needed.  . Magnesium 400 MG CAPS Take 200 mg by mouth 2 (two) times daily.   . metFORMIN (GLUCOPHAGE) 500 MG tablet Take 1 tablet by mouth daily.  . Multiple Vitamin (MULTIVITAMIN) tablet Take 1 tablet by mouth daily.  . rosuvastatin (CRESTOR) 20 MG tablet Take 1 tablet (20 mg total) by mouth daily.  . sacubitril-valsartan (ENTRESTO) 49-51 MG Take 1 tablet by mouth 2 (two) times daily.  . tadalafil (CIALIS) 5 MG tablet Take 5 mg by mouth daily as needed for erectile dysfunction.   Current Facility-Administered Medications for the 08/02/19 encounter (Office Visit) with Darreld Mclean, PA-C  Medication  . 0.9 %  sodium chloride infusion     Allergies:   Beta adrenergic blockers and Betapace [sotalol hcl]   Social History   Socioeconomic History  . Marital status: Married    Spouse name: Not on file  . Number of children: 1  . Years of education: college  . Highest education level: Not on file  Occupational History  . 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 Smoking status: Former  Smoker    Years: 35.00    Types: Pipe    Quit date: 02/21/1968    Years since quitting: 51.4  . Smokeless tobacco: Never Used  Substance and Sexual Activity  . Alcohol use: Yes    Comment: rare  . Drug use: No  . Sexual activity: Not on file  Other Topics Concern  . Not on file  Social History Narrative  . Not on file   Social Determinants of Health   Financial Resource Strain:   . Difficulty of Paying Living Expenses: Not on file  Food Insecurity:   . Worried About Charity fundraiser in the Last Year: Not on file  . Ran Out of Food in the Last Year: Not on file  Transportation Needs:   . Lack of Transportation (Medical): Not on file  . Lack of Transportation (Non-Medical): Not on file  Physical Activity:   . Days of Exercise per Week: Not on  file  . Minutes of Exercise per Session: Not on file  Stress:   . Feeling of Stress : Not on file  Social Connections:   . Frequency of Communication with Friends and Family: Not on file  . Frequency of Social Gatherings with Friends and Family: Not on file  . Attends Religious Services: Not on file  . Active Member of Clubs or Organizations: Not on file  . Attends Archivist Meetings: Not on file  . Marital Status: Not on file     Family History: The patient's family history is not on file. He was adopted.  ROS:   Please see the history of present illness.    All other systems reviewed and are negative.  EKGs/Labs/Other Studies Reviewed:    The following studies were reviewed today:  Echocardiogram 04/09/2019: Impressions: 1. The left ventricle has moderate-severely reduced systolic function, with an ejection fraction of 30-35%. The cavity size was severely dilated. Left ventricular diastolic function could not be evaluated secondary to atrial fibrillation. Left  ventricular diffuse hypokinesis.  2. The right ventricle has normal systolic function. The cavity was normal. There is no increase in right ventricular  wall thickness. Right ventricular systolic pressure is moderately elevated.  3. The aortic valve is tricuspid. Moderate sclerosis of the aortic valve. Aortic valve regurgitation is mild by color flow Doppler.  4. The aorta is abnormal unless otherwise noted.  5. There is mild dilatation of the aortic root and of the ascending aorta measuring 39 mm and 38mm respectively.  6. Mitral valve regurgitation is mild to moderate by color flow Doppler.  7. Left atrial size was mildly dilated.  8. The inferior vena cava was dilated in size with >50% respiratory variability. _______________  Jeremy Johann Myoview 04/09/2019:  Nuclear stress EF: 31%.  Defect 1: There is a large defect of severe severity present in the basal inferior, mid inferior, apical inferior and apex location.  This is a high risk study.  Findings consistent with prior myocardial infarction.  The left ventricular ejection fraction is moderately decreased (30-44%).   High risk, abnormal nuclear stress test revealing prior inferior and apical infarct but no ischemia.  Gated ejection fraction 31% with global hypokinesis.  Severe left ventricular enlargement.  Study interpreted as high risk due to reduced LV function.  EKG:  EKG ordered today. Personally reviewed and demonstrates normal sinus rhythm, rate 64 bpm, with PVC, left axis deviation, and LBBB. No acute changes compared to tracing from 05/2019.  Recent Labs: No results found for requested labs within last 8760 hours.  Recent Lipid Panel    Component Value Date/Time   CHOL 105 (L) 03/11/2016 1419   CHOL 131 03/07/2015 1514   TRIG 81 03/11/2016 1419   TRIG 130 03/07/2015 1514   HDL 45 03/11/2016 1419   HDL 41 03/07/2015 1514   CHOLHDL 2.3 03/11/2016 1419   VLDL 16 03/11/2016 1419   LDLCALC 44 03/11/2016 1419   LDLCALC 64 03/07/2015 1514    Physical Exam:    Vital Signs: BP 124/74   Pulse 67   Temp (!) 97.2 F (36.2 C)   Ht 6\' 3"  (1.905 m)   Wt 221 lb (100.2 kg)    SpO2 98%   BMI 27.62 kg/m     Wt Readings from Last 3 Encounters:  08/02/19 221 lb (100.2 kg)  05/13/19 224 lb (101.6 kg)  04/09/19 232 lb (105.2 kg)     General: 73 y.o. male in no acute distress. HEENT:  Normocephalic and atraumatic. Sclera clear. EOMs intact. Neck: Supple. No JVD. Heart: RRR. Distinct S1 and S2. No murmurs, gallops, or rubs. Radial pulses 2+ and equal bilaterally. Lungs: No increased work of breathing. Clear to ausculation bilaterally. No wheezes, rhonchi, or rales.  Abdomen: Soft, non-distended, and non-tender to palpation. Bowel sounds present. MSK: Normal strength and tone for age.  Extremities: No lower extremity edema.    Skin: Warm and dry. Neuro: Alert and oriented x3. No focal deficits. Psych: Normal affect. Responds appropriately.   Assessment:    1. Dyspnea, unspecified type   2. Chronic systolic CHF (congestive heart failure) (HCC)   3. Palpitations   4. Paroxysmal atrial fibrillation/flutter   5. History of sick sinus syndrome   6. LBBB (left bundle branch block)   7. Thoracic ascending aortic aneurysm ([place])   8. Essential hypertension   9. Hyperlipidemia, unspecified hyperlipidemia type     Plan:    Dyspnea - Patient presents with worsening shortness of breath. See HPI for more details. - Recent Echo form 03/2019 showed LVEF of 30-35%. Lexiscan showed no acute ischemia. There was a large fixed defect on Myoview that was read as consistent with prior MI but Dr. Debara Pickett felt like it was LBBB artifact. - No acute EKG changes. - Patient appears euvolemic on exam so I do not think this is decompensated CHF. Will check BNP. Will also check CBC and TSH. - Patient does note that his heart rate feels very irregular when he is short of breath so question whether this is atrial fibrillation/flutter. Offered monitor but patient is scheduled to see Dr. Ola Spurr (EP at [facility]) on 08/18/2019 and would prefer to let Dr. Ola Spurr order this if needed. - If  work-up today and work-up with Dr. Ola Spurr is unremarkable and dyspnea persists, may need to consider right/left heart catheterization to see if dyspnea is anginal equivalent. Patient is not tachycardic, tachypneic, or hypoxic so low suspicion for PE. No fevers or cough to suggest infectious etiology.   Chronic Systolic CHF - Most recent Echo from 03/2019 showed LVEF of 30-35% with diffuse hypokinesis, mild AI, mild to moderate MR, moderately elevated RVSP. Lexiscan Myoview showed no reversible ischemia.  - Patient does report worsening shortness of breath with exertion as well as occasional orthopnea/PND but appears euvolemic on exam. - Continue Entresto 49-51mg  twice daily.  - No beta-blocker due to history of sick sinus syndrome.  - Will check BNP and BMET today as above. If BNP elevated, may order Lasix. - Plan is for repeat Echo in 11/2019. If LVEF has not improved, will need to refer back to EP for possible CRT-P/D therapy. Actually scheduled to see Dr. Ola Spurr at [facility] early next month to discuss this.   Palpitations with History Atrial Fibrillation/Flutter s/p Ablation and History of Sick Sinus Syndrome - S/p ablation in 2009 with subsequent isthmus ablation for atrial flutter by Dr. Ola Spurr at [facility]. No recurrence since ablation. - RRR on exam today. - Patient does report almost daily palpitations with his shortness of breath. Offered monitor but patient would like to defer to Dr. Ola Spurr who he is scheduled to see on 08/18/2019. - Will check TSH today.  LBBB - Lexiscan Myoview from 03/2019 showed large defect consistent with prior MI but no reversible ischemia. Dr. Debara Pickett felt findings could be associated with LBBB artifact. - Patient scheduled to see Dr. Ola Spurr to talk about whether LBBB is contributing to cardiomyopathy.  Ascending Thoracic Aortic Aneurysm  - Echo from 03/2019  showed mild dilatation of the aortic root and the ascending aorta measuring 39 mm  and 45 mm respectively. - CTA has been ordered and is scheduled for 09/2019.  Hypertension - BP well controlled at 124/74. - Continue current medications.  Hyperlipidemia - Continue Crestor 20mg  daily.  Disposition: Follow up in 2-3 months with Dr. Debara Pickett.   Medication Adjustments/Labs and Tests Ordered: Current medicines are reviewed at length with the patient today.  Concerns regarding medicines are outlined above.  Orders Placed This Encounter  Procedures  . CBC with Differential  . Basic Metabolic Panel (BMET)  . Pro b natriuretic peptide (BNP)[facility])  . TSH  . EKG 12-Lead   No orders of the defined types were placed in this encounter.   Patient Instructions  Medication Instructions:  Your physician recommends that you continue on your current medications as directed. Please refer to the Current Medication list given to you today. *If you need a refill on your cardiac medications before your next appointment, please call your pharmacy*  Lab Work: Your physician recommends that you return for lab work in: TODAY-BMET, BNP, CBC, TSH If you have labs (blood work) drawn today and your tests are completely normal, you will receive your results only by: Marland Kitchen MyChart Message (if you have MyChart) OR . A paper copy in the mail If you have any lab test that is abnormal or we need to change your treatment, we will call you to review the results.  Testing/Procedures: NONE   Follow-Up: At [facility], you and your health needs are our priority.  As part of our continuing mission to provide you with exceptional heart care, we have created designated Provider Care Teams.  These Care Teams include your primary Cardiologist (physician) and Advanced Practice Providers (APPs -  Physician Assistants and Nurse Practitioners) who all work together to provide you with the care you need, when you need it.  Your next appointment:   2-3 month(s)  The format for your  next appointment:   In Person  Provider:   K. Mali [name], MD  Other Instructions     Signed, Darreld Mclean, PA-C  08/02/2019 2:09 PM    [place]

## 2019-08-02 ENCOUNTER — Ambulatory Visit (INDEPENDENT_AMBULATORY_CARE_PROVIDER_SITE_OTHER): Payer: Medicare Other | Admitting: Student

## 2019-08-02 ENCOUNTER — Encounter: Payer: Self-pay | Admitting: Physician Assistant

## 2019-08-02 ENCOUNTER — Other Ambulatory Visit: Payer: Self-pay

## 2019-08-02 VITALS — BP 124/74 | HR 67 | Temp 97.2°F | Ht 75.0 in | Wt 221.0 lb

## 2019-08-02 DIAGNOSIS — R0600 Dyspnea, unspecified: Secondary | ICD-10-CM

## 2019-08-02 DIAGNOSIS — I10 Essential (primary) hypertension: Secondary | ICD-10-CM

## 2019-08-02 DIAGNOSIS — I7121 Aneurysm of the ascending aorta, without rupture: Secondary | ICD-10-CM

## 2019-08-02 DIAGNOSIS — R002 Palpitations: Secondary | ICD-10-CM

## 2019-08-02 DIAGNOSIS — I712 Thoracic aortic aneurysm, without rupture: Secondary | ICD-10-CM

## 2019-08-02 DIAGNOSIS — I5022 Chronic systolic (congestive) heart failure: Secondary | ICD-10-CM

## 2019-08-02 DIAGNOSIS — I447 Left bundle-branch block, unspecified: Secondary | ICD-10-CM

## 2019-08-02 DIAGNOSIS — Z8679 Personal history of other diseases of the circulatory system: Secondary | ICD-10-CM

## 2019-08-02 DIAGNOSIS — I480 Paroxysmal atrial fibrillation: Secondary | ICD-10-CM | POA: Diagnosis not present

## 2019-08-02 DIAGNOSIS — E785 Hyperlipidemia, unspecified: Secondary | ICD-10-CM

## 2019-08-02 NOTE — Patient Instructions (Signed)
Medication Instructions:  Your physician recommends that you continue on your current medications as directed. Please refer to the Current Medication list given to you today. *If you need a refill on your cardiac medications before your next appointment, please call your pharmacy*  Lab Work: Your physician recommends that you return for lab work in: TODAY-BMET, BNP, CBC, TSH If you have labs (blood work) drawn today and your tests are completely normal, you will receive your results only by: Marland Kitchen MyChart Message (if you have MyChart) OR . A paper copy in the mail If you have any lab test that is abnormal or we need to change your treatment, we will call you to review the results.  Testing/Procedures: NONE   Follow-Up: At [facility], you and your health needs are our priority.  As part of our continuing mission to provide you with exceptional heart care, we have created designated Provider Care Teams.  These Care Teams include your primary Cardiologist (physician) and Advanced Practice Providers (APPs -  Physician Assistants and Nurse Practitioners) who all work together to provide you with the care you need, when you need it.  Your next appointment:   2-3 month(s)  The format for your next appointment:   In Person  Provider:   K. Mali [name], MD  Other Instructions

## 2019-08-03 ENCOUNTER — Telehealth: Payer: Self-pay

## 2019-08-03 LAB — CBC WITH DIFFERENTIAL/PLATELET
Basophils Absolute: 0 10*3/uL (ref 0.0–0.2)
Basos: 1 %
EOS (ABSOLUTE): 0.2 10*3/uL (ref 0.0–0.4)
Eos: 3 %
Hematocrit: 39.8 % (ref 37.5–51.0)
Hemoglobin: 13.3 g/dL (ref 13.0–17.7)
Immature Grans (Abs): 0 10*3/uL (ref 0.0–0.1)
Immature Granulocytes: 0 %
Lymphocytes Absolute: 1.4 10*3/uL (ref 0.7–3.1)
Lymphs: 25 %
MCH: 30.9 pg (ref 26.6–33.0)
MCHC: 33.4 g/dL (ref 31.5–35.7)
MCV: 93 fL (ref 79–97)
Monocytes Absolute: 0.5 10*3/uL (ref 0.1–0.9)
Monocytes: 10 %
Neutrophils Absolute: 3.4 10*3/uL (ref 1.4–7.0)
Neutrophils: 61 %
Platelets: 227 10*3/uL (ref 150–450)
RBC: 4.3 x10E6/uL (ref 4.14–5.80)
RDW: 13.1 % (ref 11.6–15.4)
WBC: 5.5 10*3/uL (ref 3.4–10.8)

## 2019-08-03 LAB — BASIC METABOLIC PANEL
BUN/Creatinine Ratio: 19 (ref 10–24)
BUN: 20 mg/dL (ref 8–27)
CO2: 26 mmol/L (ref 20–29)
Calcium: 10.2 mg/dL (ref 8.6–10.2)
Chloride: 102 mmol/L (ref 96–106)
Creatinine, Ser: 1.06 mg/dL (ref 0.76–1.27)
GFR calc Af Amer: 80 mL/min/{1.73_m2} (ref >59)
GFR calc non Af Amer: 69 mL/min/{1.73_m2} (ref >59)
Glucose: 84 mg/dL (ref 65–99)
Potassium: 4.1 mmol/L (ref 3.5–5.2)
Sodium: 142 mmol/L (ref 134–144)

## 2019-08-03 LAB — TSH: TSH: 3.09 u[IU]/mL (ref 0.450–4.500)

## 2019-08-03 LAB — PRO B NATRIURETIC PEPTIDE: NT-Pro BNP: 1910 pg/mL — ABNORMAL HIGH (ref 0–376)

## 2019-08-03 MED ORDER — FUROSEMIDE 20 MG PO TABS: ORAL_TABLET | ORAL | 3 refills | Status: DC

## 2019-08-03 NOTE — Telephone Encounter (Signed)
Spoke to patient's wife lab results and instructions given.Advised to call back if he continues to have sob.

## 2019-08-30 ENCOUNTER — Telehealth: Payer: Self-pay | Admitting: Internal Medicine

## 2019-08-30 NOTE — Telephone Encounter (Signed)
New Message:    Pt wanted to make sure Dr Debara Pickett know that he have been in the hospital [facility] [place]. He ws admitted there on 08-21-19. He also  Wanted him to know he cx his CT for 09-13-19.

## 2019-08-30 NOTE — Telephone Encounter (Signed)
Sent to MD as FYI

## 2019-09-12 ENCOUNTER — Ambulatory Visit: Payer: Medicare Other

## 2019-09-13 ENCOUNTER — Inpatient Hospital Stay: Admission: RE | Admit: 2019-09-13 | Payer: Medicare Other | Source: Ambulatory Visit

## 2019-09-15 ENCOUNTER — Other Ambulatory Visit: Payer: Self-pay | Admitting: Internal Medicine

## 2019-09-15 DIAGNOSIS — I712 Thoracic aortic aneurysm, without rupture, unspecified: Secondary | ICD-10-CM

## 2019-09-15 DIAGNOSIS — Z01812 Encounter for preprocedural laboratory examination: Secondary | ICD-10-CM

## 2019-09-19 ENCOUNTER — Ambulatory Visit: Payer: Medicare Other | Attending: Internal Medicine

## 2019-09-19 DIAGNOSIS — Z23 Encounter for immunization: Secondary | ICD-10-CM

## 2019-09-19 NOTE — Progress Notes (Signed)
   Covid-19 Vaccination Clinic  Name:  Edward [name]    MRN: [id number] DOB: 05-16-1946  09/19/2019  [name] was observed post Covid-19 immunization for 15 minutes without incidence. He was provided with Vaccine Information Sheet and instruction to access the V-Safe system.   [name] was instructed to call 911 with any severe reactions post vaccine: Marland Kitchen Difficulty breathing  . Swelling of your face and throat  . A fast heartbeat  . A bad rash all over your body  . Dizziness and weakness    Immunizations Administered    Name Date Dose VIS Date Route   Pfizer COVID-19 Vaccine 09/19/2019  8:30 AM 0.3 mL 07/23/2019 Intramuscular   Manufacturer: [place]   Lot: [id number]   [place]: [id number]

## 2019-10-03 ENCOUNTER — Ambulatory Visit: Payer: Medicare Other

## 2019-10-14 ENCOUNTER — Ambulatory Visit: Payer: Medicare Other | Attending: Internal Medicine

## 2019-10-14 DIAGNOSIS — Z23 Encounter for immunization: Secondary | ICD-10-CM | POA: Insufficient documentation

## 2019-10-14 NOTE — Progress Notes (Signed)
   Covid-19 Vaccination Clinic  Name:  EDGER FENNEWALD    MRN: [id number] DOB: January 26, 1946  10/14/2019  Mr. Piech was observed post Covid-19 immunization for 15 minutes without incident. He was provided with Vaccine Information Sheet and instruction to access the V-Safe system.   Mr. Delapp was instructed to call 911 with any severe reactions post vaccine: Marland Kitchen Difficulty breathing  . Swelling of face and throat  . A fast heartbeat  . A bad rash all over body  . Dizziness and weakness   Immunizations Administered    Name Date Dose VIS Date Route   Pfizer COVID-19 Vaccine 10/14/2019  8:22 AM 0.3 mL 07/23/2019 Intramuscular   Manufacturer: [place]   Lot: [id number]   [place]: [id number]

## 2019-11-04 ENCOUNTER — Ambulatory Visit: Payer: Medicare Other | Admitting: Internal Medicine

## 2019-11-22 ENCOUNTER — Other Ambulatory Visit (HOSPITAL_COMMUNITY): Payer: Medicare Other

## 2019-12-01 ENCOUNTER — Encounter: Payer: Self-pay | Admitting: Internal Medicine

## 2019-12-01 ENCOUNTER — Ambulatory Visit (INDEPENDENT_AMBULATORY_CARE_PROVIDER_SITE_OTHER): Payer: Medicare Other | Admitting: Internal Medicine

## 2019-12-01 ENCOUNTER — Other Ambulatory Visit: Payer: Self-pay

## 2019-12-01 VITALS — BP 122/70 | HR 59 | Ht 75.0 in | Wt 217.6 lb

## 2019-12-01 DIAGNOSIS — I480 Paroxysmal atrial fibrillation: Secondary | ICD-10-CM

## 2019-12-01 DIAGNOSIS — I2510 Atherosclerotic heart disease of native coronary artery without angina pectoris: Secondary | ICD-10-CM

## 2019-12-01 DIAGNOSIS — I447 Left bundle-branch block, unspecified: Secondary | ICD-10-CM

## 2019-12-01 DIAGNOSIS — I5022 Chronic systolic (congestive) heart failure: Secondary | ICD-10-CM | POA: Diagnosis not present

## 2019-12-01 NOTE — Patient Instructions (Signed)

## 2019-12-01 NOTE — Progress Notes (Signed)
OFFICE NOTE  Chief Complaint:  Routine follow-up  Primary Care Physician: [place]  HPI:  Edward [name] is a pleasant 74 year old retired [employer] who has a history of sick sinus syndrome and paroxysmal atrial fibrillation in the past. He underwent complex ablation by Dr. Ola Spurr at [facility] in 2009. Subsequently he had a isthmus ablation for atrial flutter. He also has obstructive sleep apnea, BPH, hypertension, dyslipidemia and a former smoking history. Recently he suffered a fracture of the right ankle and has had subsequent swelling and pain as well as warmth of the right ankle and calf. He also has a known left bundle branch block and he underwent stress testing in 2013 which was negative for ischemia. He hasn't had an echocardiogram at that time which showed an EF greater than 55% with moderately dilated atria and mild aortic regurgitation.  At his last office visit he had some lower extremity swelling with warmth and signs of congestion. I ordered an ultrasound in the office to rule out DVT which was negative. He still reports intermittent swelling and has had some improvement with wearing stockings but is not wearing them currently due to the hot weather.  Edward [name] returns today for follow-up. Overall he is doing fairly well. He denies any recurrent palpitations or atrial fibrillation. His CHADSVASC score is 1 and he remains on aspirin. He's been struggling with prostate enlargement is followed by Dr. Roni Bread. He's been taking Crestor with some mild memory impairment which he says is better if he misses a dose. Overall is cause a marked improvement in his cholesterol which is reduced by about 50%. He's due for repeat cholesterol test today. Overall blood pressure control is been fairly good although it is a little bit labile. He still has some right leg swelling.  03/11/2016  Edward [name] returns today for follow-up. He denies any chest pain  or worsening shortness of breath. He recalled a recent trip to [place] where he did hiking of 6-10 miles in over 100 temperature is without any significant problems. Blood pressure however is elevated at 150/90 today. Recheck was essentially unchanged. He occasionally does not take his diuretics when he is going to be outside in hot weather. He did take all this medicine today.  04/15/2017  Edward [name] returns today for annual follow-up. He recently established with a primary care provider. He had an extensive lab panel performed by private company. This indicated an elevated hemoglobin A1c of close to 8. His fasting blood sugar subsequently was improved however he made significant dietary changes. Nevertheless he is on low-dose metformin. He reports compliance with CPAP. His A. fib is not bothersome and is not recently had any episodes - he has a history of ablation in 2009 and has had no recurrence. He is on aspirin for this. Blood pressure is well-controlled today as a recheck blood pressure came down to 128/82. He denies any chest pain or worsening shortness of breath. He reports some concern about abdominal aneurysm. He reports his father had one and that died from it.  06/05/2018  Edward [name] is seen today in follow-up.  Overall he is done well and denies any new chest pain or worsening shortness of breath.  Fortunately he had a screening AAA ultrasound which was negative.  He denies any recurrent A. fib episodes.  EKG shows sinus rhythm today with some PACs and PVCs.  He has persistent left bundle branch block.  Blood pressure was initially elevated however  did come down and recheck.    04/01/2019  Edward [name] is seen today in recurrent follow-up.  He was scheduled to be seen in October however had recent episodes where he noted his heart was racing with exertion.  Particularly if he was walking up hills or on inclines he noted that his heart rate would shoot up.  He does not note any increase in  heart rate with other episodes.  He also has noted his blood pressure running higher than normal.  EKG shows sinus rhythm with a left bundle branch block and QRS duration of 164 ms.  Heart rate is 63.  Denies any significant shortness of breath.  05/13/2019  Edward [name] returns today for follow-up.  He underwent an echo which demonstrated a newly reduced LVEF of 30 to 35%.  Only, his ascending aorta was dilated enlarged to 4.5 cm.  A nuclear stress test was also performed which showed an EF of 31% and a fixed defect in the basal inferior mid inferior and apical inferior areas either consistent with infarction or possibly diaphragmatic attenuation.  There was no reversible ischemia.  EKG does not suggest inferior infarct rather has left bundle branch block morphology.  This findings could also be associated with a left bundle branch block artifact.  I suspect this is a nonischemic cardiomyopathy.  Based on these findings switched his ARB medication over to an Entresto 49/51 mg twice daily.  He initially had difficulty tolerating that and was also on amlodipine.  Apparently his blood pressure got very low with it and then he discontinued amlodipine himself.  Since then he is felt much better.  Blood pressure today is 102/76.  He does feel like he is breathing better in fact can walk up hills without being short of breath and generally endorses NYHA class I-II symptoms at this point.  12/01/2019  Edward [name] returns today for follow-up.  He has had quite an eventful spring.  After being seen in December for worsening shortness of breath, ultimately he followed up with his electrophysiologist Dr. Ola Spurr in January.  At that time he was placed on a monitor and subsequently found to have nonsustained VT as well as some intermittent A. fib.  Based on his reduced LVEF, he underwent left and right heart catheterization.  This showed multivessel coronary disease and after some discussion had a staged PCI with DES  to the mid LAD, DES to first diagonal DES to first obtuse marginal.  LVEF at the time was 25 to 30% with a left bundle branch block.  As this did not significantly improve, he was seen again by Dr. Ola Spurr who is planning on what sounds like a biventricular CRT-D device.  Edward [name] wishes to follow-up with [place] for general cardiology care.  He is currently on aspirin and prasugrel.  He will likely need to hold the prasugrel prior to device placement.  He denies any worsening heart failure symptoms and appears euvolemic on exam today.  He is on torsemide 40 mg daily along with Entresto 49/51 mg twice daily.  PMHx:  Past Medical History:  Diagnosis Date  . Atrial fibrillation ([place])    PAF; ablation in 2009  . Back pain   . BPH (benign prostatic hyperplasia)   . History of nuclear stress test 11/2011   lexiscan; mild ischemia in apical inferior & apical lateral regions  . Hyperlipidemia   . Hypertension   . Knee pain    bilat arthritis  . OSA (obstructive sleep  apnea)    CPAP; sleep study 06/2007 - AHI during total sleep 25.95/hr and during REM 32.57/hr (moderate sleep apnea); postural component w/severe sleep apnea during supine (AHI 76.36/hr)  . Ruptured lumbar disc   . SSS (sick sinus syndrome) [facility])     Past Surgical History:  Procedure Laterality Date  . ATRIAL FIBRILLATION ABLATION  2009   [facility] - isthmius tricuspid abaltion w/no evidence of ABNRT  . BACK SURGERY  1981  . COLONOSCOPY    . KNEE SURGERY Bilateral [place]   3 surg on right/ 1 surg on left  . NOSE SURGERY     couple surg  . TRANSTHORACIC ECHOCARDIOGRAM  04/2012   EF=>55%; mod conc LVH; LA mod dilated; mild MR; trace TR; AV mildly sclerotic & mild regurg; aortic root sclerosis/calcif    FAMHx:  Family History  Adopted: Yes    SOCHx:   reports that he quit smoking about 51 years ago. His smoking use included pipe. He quit after 35.00 years of use. He has never used smokeless tobacco. He reports current  alcohol use. He reports that he does not use drugs.  ALLERGIES:  Allergies  Allergen Reactions  . Beta Adrenergic Blockers   . Betapace [Sotalol Hcl]     ROS: Pertinent items noted in HPI and remainder of comprehensive ROS otherwise negative.  HOME MEDS: Current Outpatient Medications  Medication Sig Dispense Refill  . albuterol (VENTOLIN HFA) 108 (90 Base) MCG/ACT inhaler Inhale 2 puffs into the lungs every 6 (six) hours as needed.    Marland Kitchen amiodarone (PACERONE) 200 MG tablet Take 200 mg by mouth 2 (two) times daily.    Marland Kitchen aspirin EC 81 MG tablet Take 81 mg by mouth daily.    . budesonide-formoterol (SYMBICORT) 160-4.5 MCG/ACT inhaler Inhale 2 puffs into the lungs as needed.    . Coenzyme Q10 (CO Q 10) 100 MG CAPS Take 200 mg by mouth daily.    . fish oil-omega-3 fatty acids 1000 MG capsule Take 2 g by mouth daily.    . fluticasone (FLONASE) 50 MCG/ACT nasal spray Place into the nose.    . Ibuprofen (ADVIL PO) Take by mouth as needed.    . Magnesium 400 MG CAPS Take 200 mg by mouth 2 (two) times daily.     . metFORMIN (GLUCOPHAGE) 500 MG tablet Take 1 tablet by mouth daily.  3  . Multiple Vitamin (MULTIVITAMIN) tablet Take 1 tablet by mouth daily.    . potassium chloride SA (KLOR-CON) 20 MEQ tablet Take 20 mEq by mouth daily.    . prasugrel (EFFIENT) 10 MG TABS tablet Take 10 mg by mouth daily.    . rosuvastatin (CRESTOR) 20 MG tablet Take 1 tablet (20 mg total) by mouth daily. 90 tablet 0  . sacubitril-valsartan (ENTRESTO) 49-51 MG Take 1 tablet by mouth 2 (two) times daily. 60 tablet 11  . tadalafil (CIALIS) 5 MG tablet Take 5 mg by mouth daily as needed for erectile dysfunction.    . tamsulosin (FLOMAX) 0.4 MG CAPS capsule Take 0.4 mg by mouth daily.    Marland Kitchen torsemide (DEMADEX) 20 MG tablet Take 20 mg by mouth daily. Patient takes 40mg  daily     Current Facility-Administered Medications  Medication Dose Route Frequency Provider Last Rate Last Admin  . 0.9 %  sodium chloride infusion   500 mL Intravenous Continuous Sable Feil, MD        LABS/IMAGING: No results found for this or any previous visit (from  the past 48 hour(s)). No results found.  VITALS: BP 122/70   Pulse (!) 59   Ht 6\' 3"  (1.905 m)   Wt 217 lb 9.6 oz (98.7 kg)   SpO2 98%   BMI 27.20 kg/m   EXAM: General appearance: alert and no distress Neck: no carotid bruit and no JVD Lungs: clear to auscultation bilaterally Heart: regular rate and rhythm, S1, S2 normal, no murmur, click, rub or gallop Abdomen: soft, non-tender; bowel sounds normal; no masses,  no organomegaly and No pulsatile abdominal mass Extremities: no significant edema Pulses: 2+ and symmetric Skin: no skin discoloration Neurologic: Motor: grossly normal Psych: Mildly anxious  EKG: Deferred  ASSESSMENT: 1. Recent multivessel coronary artery disease status post PCI to the LAD, left circumflex and first diagonal with drug-eluting stents 2. Acute systolic congestive heart failure, LVEF 30 to 35% (03/2019), no reversible ischemia on Myoview stress testing -LVEF 25 to 30% by cardiac cath (08/2019) 3. LBBB with wide QRSD of close to 160 ms 4. History of sick sinus syndrome 5. Paroxysmal atrial fibrillation status post AF-ablation and a-flutter ablation (no recurrence since 2009-on aspirin) 6. HTN 7. Dyslipidemia 8. Hypertension 9. Family history of AAA-negative screening ultrasound (04/2017)  10. Ventricular tachycardia  PLAN: 1.   Edward [name] was recently seen by his electrophysiologist in [facility] and found to have ventricular tachycardia as well as PAF.  Given his reduced LV function he underwent left and right heart catheterization and was found to have multivessel coronary disease.  He was apparently not felt to be a good surgical candidate, possibly because of LV dysfunction and underwent PCI to the mid LAD, first diagonal and first obtuse marginal branches.  Subsequent to that he does report some improvement in his energy.   LVEF has remained suppressed.  He is scheduled to follow-up with Dr. Ola Spurr for evaluation of his CRT-D device.  He will likely need to hold his Effient prior to that procedure.   Plan follow-up with me in 6 months.  Pixie Casino, MD, [facility], [place] Director of the Advanced Lipid Disorders &  Cardiovascular Risk Reduction Clinic Diplomate of the American Board of Clinical Lipidology Attending Cardiologist  Direct Dial: [phone]  Fax: [phone]  Website:  [email or web address] 12/01/2019, 3:36 PM

## 2020-04-14 ENCOUNTER — Other Ambulatory Visit: Payer: Self-pay | Admitting: Internal Medicine

## 2020-04-19 ENCOUNTER — Telehealth: Payer: Self-pay | Admitting: *Deleted

## 2020-04-19 NOTE — Telephone Encounter (Signed)
Edward [name], Can you call this patient and set him up for an office visit, he may not be appropriate for [facility] with his cardiac history.  Thanks!

## 2020-04-19 NOTE — Telephone Encounter (Signed)
Also need guidance on his platelet medication, Effient.  Thanks

## 2020-04-21 NOTE — Telephone Encounter (Signed)
Patient was scheduled for endo/colon as well as PV. I spoke with the patients wife and explained that the patient needs to have an OV. OV scheduled with PA on 05/24/20 @ 2:50. Patient is on BT and has extensive heart conditions such as LVEF 30-35 % and may need to have procedures at [facility]. The patients wife agreed and procedures cancelled per [facility] guidelines.   Riki Sheer, LPN ( PV )

## 2020-05-18 ENCOUNTER — Encounter: Payer: Medicare Other | Admitting: Internal Medicine

## 2020-05-24 ENCOUNTER — Ambulatory Visit: Payer: Medicare Other | Admitting: Nurse Practitioner

## 2020-06-05 ENCOUNTER — Other Ambulatory Visit: Payer: Self-pay

## 2020-06-05 ENCOUNTER — Telehealth: Payer: Self-pay

## 2020-06-05 ENCOUNTER — Telehealth (INDEPENDENT_AMBULATORY_CARE_PROVIDER_SITE_OTHER): Payer: Medicare Other | Admitting: Physician Assistant

## 2020-06-05 ENCOUNTER — Encounter: Payer: Self-pay | Admitting: Physician Assistant

## 2020-06-05 VITALS — BP 113/64 | Ht 75.0 in | Wt 200.0 lb

## 2020-06-05 DIAGNOSIS — I480 Paroxysmal atrial fibrillation: Secondary | ICD-10-CM | POA: Diagnosis not present

## 2020-06-05 DIAGNOSIS — I340 Nonrheumatic mitral (valve) insufficiency: Secondary | ICD-10-CM

## 2020-06-05 DIAGNOSIS — E785 Hyperlipidemia, unspecified: Secondary | ICD-10-CM | POA: Diagnosis not present

## 2020-06-05 DIAGNOSIS — E119 Type 2 diabetes mellitus without complications: Secondary | ICD-10-CM

## 2020-06-05 DIAGNOSIS — I255 Ischemic cardiomyopathy: Secondary | ICD-10-CM

## 2020-06-05 DIAGNOSIS — I10 Essential (primary) hypertension: Secondary | ICD-10-CM | POA: Diagnosis not present

## 2020-06-05 DIAGNOSIS — I2510 Atherosclerotic heart disease of native coronary artery without angina pectoris: Secondary | ICD-10-CM | POA: Diagnosis not present

## 2020-06-05 DIAGNOSIS — Z95810 Presence of automatic (implantable) cardiac defibrillator: Secondary | ICD-10-CM

## 2020-06-05 MED ORDER — ROSUVASTATIN CALCIUM 20 MG PO TABS
20.0000 mg | ORAL_TABLET | Freq: Every day | ORAL | 3 refills | Status: DC
Start: 2020-06-05 — End: 2021-05-28

## 2020-06-05 NOTE — Telephone Encounter (Signed)
Attempted to call patient to go over AVS. Left a voice message for with [facility] recommendations and stated that if he had any questions to give our office a call.

## 2020-06-05 NOTE — Patient Instructions (Signed)
Medication Instructions:  Your physician recommends that you continue on your current medications as directed. Please refer to the Current Medication list given to you today.  *If you need a refill on your cardiac medications before your next appointment, please call your pharmacy*  Lab Work: NONE ordered at this time of appointment   If you have labs (blood work) drawn today and your tests are completely normal, you will receive your results only by: MyChart Message (if you have MyChart) OR A paper copy in the mail If you have any lab test that is abnormal or we need to change your treatment, we will call you to review the results.  Testing/Procedures: NONE ordered at this time of appointment   Follow-Up: At [facility], you and your health needs are our priority.  As part of our continuing mission to provide you with exceptional heart care, we have created designated Provider Care Teams.  These Care Teams include your primary Cardiologist (physician) and Advanced Practice Providers (APPs -  Physician Assistants and Nurse Practitioners) who all work together to provide you with the care you need, when you need it.   Your next appointment:   6 month(s)  The format for your next appointment:   In Person  Provider:   K. Chad [name], MD  Other Instructions   

## 2020-06-05 NOTE — Progress Notes (Signed)
Virtual Visit via Telephone Note   This visit type was conducted due to national recommendations for restrictions regarding the COVID-19 Pandemic (e.g. social distancing) in an effort to limit this patient's exposure and mitigate transmission in our community.  Due to his co-morbid illnesses, this patient is at least at moderate risk for complications without adequate follow up.  This format is felt to be most appropriate for this patient at this time.  The patient did not have access to video technology/had technical difficulties with video requiring transitioning to audio format only (telephone).  All issues noted in this document were discussed and addressed.  No physical exam could be performed with this format.  Please refer to the patient's chart for his  consent to telehealth for [facility].    Date:  06/07/2020   ID:  Edward [name], DOB 06/19/1946, MRN [id number] The patient was identified using 2 identifiers.  Patient Location: Home Provider Location: Office/Clinic  PCP:  [place]  Cardiologist:  Pixie Casino, MD  Electrophysiologist: Dr. Ola Spurr of [facility]  Evaluation Performed:  Follow-Up Visit  Chief Complaint:  Follow up  History of Present Illness:    Edward [name] is a 74 y.o. male with past medical history of A. fib ablation in 2009, HTN, HLD, DM II, OSA, LBBB, CAD and ICM s/p CRT-D.  He is a retired [employer].  He underwent complex ablation by Dr. Ola Spurr at [facility] in 2009, subsequently he had isthmus ablation for atrial flutter as well.  Myoview in 2013 was negative for ischemia.  Echocardiogram at the time showed EF greater than 55% with moderate LAE, mild AI.  AAA ultrasound in 2019 was normal.  Repeat echocardiogram obtained in 2020 demonstrated newly reduced LVEF of 30 to 35%, dilated ascending aorta measuring at 4.5 cm.  Myoview performed showing EF 31%, fixed defect in the basal inferior  mid inferior and apical inferior area consistent with infarction or possibly diaphragmatic attenuation.  There was no reversible ischemia.  EKG does not support inferior infarct. His ARB was switched to [facility].  He was seen by Dr. Ola Spurr in January 2021 and was placed on heart monitor.  This revealed nonsustained VT as well as intermittent A. fib.  He eventually underwent left and right heart cath that revealed multivessel CAD, he was felt not to be a good surgical candidate and had staged PCI with DES to mid LAD, first diagonal and OM1, EF 25 to 30%.  He was discharged on aspirin and prasugrel with potential plan for biventricular ICD if ejection fraction does not improve.  He eventually underwent CRT-D on 12/16/2019.  Postprocedure, he had inadequate CRT capture due to significant ectopy burden, this was further adjusted. He was established with Dr. Frann Rider of advanced heart failure clinic at [facility] on 02/07/2020.  Follow-up left and right heart cath performed on 02/25/2020 revealed no significant ischemic blockage, patent stents, normal cardiac output and cardiac index, mild pulmonary hypertension and pulmonary capillary wedge pressure consistent with mild volume overload.  His performance on 6-minute walk was below predicted level for age and gender.  Diuretic was further uptitrated.  Recent echocardiogram obtained in July 2021 revealed EF 30 to 35%, severe LAE, moderate to severe MR.  This was reviewed by the valve specialist at [place], no surgical intervention was recommended at this time unless patient become more asymptomatic.  Patient presents today for virtual visit.  He is doing quite well from  cardiac perspective and can walk 2 to 3 miles per day.  Sometimes he would run up the hill.  He denies any recent chest pain or shortness of breath.  He has no lower extremity edema.  He is very much asymptomatic from the mitral valve perspective.  He can follow-up with Dr. Debara Pickett in 6 months.   We will refill the Crestor   The patient does not have symptoms concerning for COVID-19 infection (fever, chills, cough, or new shortness of breath).    Past Medical History:  Diagnosis Date  . Atrial fibrillation ([place])    PAF; ablation in 2009  . Back pain   . BPH (benign prostatic hyperplasia)   . History of nuclear stress test 11/2011   lexiscan; mild ischemia in apical inferior & apical lateral regions  . Hyperlipidemia   . Hypertension   . Knee pain    bilat arthritis  . OSA (obstructive sleep apnea)    CPAP; sleep study 06/2007 - AHI during total sleep 25.95/hr and during REM 32.57/hr (moderate sleep apnea); postural component w/severe sleep apnea during supine (AHI 76.36/hr)  . Ruptured lumbar disc   . SSS (sick sinus syndrome) [facility])    Past Surgical History:  Procedure Laterality Date  . ATRIAL FIBRILLATION ABLATION  2009   [facility] - isthmius tricuspid abaltion w/no evidence of ABNRT  . BACK SURGERY  1981  . COLONOSCOPY    . KNEE SURGERY Bilateral [place]   3 surg on right/ 1 surg on left  . NOSE SURGERY     couple surg  . TRANSTHORACIC ECHOCARDIOGRAM  04/2012   EF=>55%; mod conc LVH; LA mod dilated; mild MR; trace TR; AV mildly sclerotic & mild regurg; aortic root sclerosis/calcif     Current Meds  Medication Sig  . albuterol (VENTOLIN HFA) 108 (90 Base) MCG/ACT inhaler Inhale 2 puffs into the lungs every 6 (six) hours as needed.  Marland Kitchen amiodarone (PACERONE) 200 MG tablet Take 200 mg by mouth 2 (two) times daily.  Marland Kitchen aspirin EC 81 MG tablet Take 81 mg by mouth daily.  . carvedilol (COREG) 3.125 MG tablet Take 3.125 mg by mouth 2 (two) times daily.  . Coenzyme Q10 (CO Q 10) 100 MG CAPS Take 200 mg by mouth daily.  . dapagliflozin propanediol (FARXIGA) 5 MG TABS tablet Take 1 tablet by mouth daily.  Marland Kitchen ENTRESTO 49-51 MG TAKE 1 TABLET BY MOUTH TWICE DAILY  . fish oil-omega-3 fatty acids 1000 MG capsule Take 2 g by mouth daily.  . fluticasone (FLONASE) 50 MCG/ACT  nasal spray Place into the nose.  . Ibuprofen (ADVIL PO) Take by mouth as needed.  . Magnesium 400 MG CAPS Take 400 mg by mouth 2 (two) times daily.   . Multiple Vitamin (MULTIVITAMIN) tablet Take 1 tablet by mouth daily.  . potassium chloride SA (KLOR-CON) 20 MEQ tablet Take 40 mEq by mouth daily.   . prasugrel (EFFIENT) 10 MG TABS tablet Take 10 mg by mouth daily.  . tadalafil (CIALIS) 5 MG tablet Take 5 mg by mouth daily as needed for erectile dysfunction.  . tamsulosin (FLOMAX) 0.4 MG CAPS capsule Take 0.4 mg by mouth daily.  Marland Kitchen torsemide (DEMADEX) 20 MG tablet Take 60 mg by mouth daily. Take 60 mg by mouth daily  . [DISCONTINUED] rosuvastatin (CRESTOR) 20 MG tablet Take 1 tablet (20 mg total) by mouth daily.   Current Facility-Administered Medications for the 06/05/20 encounter (Telemedicine) with Almyra Deforest, PA  Medication  . 0.9 %  sodium chloride infusion     Allergies:   Beta adrenergic blockers and Betapace [sotalol hcl]   Social History   Tobacco Use  . Smoking status: Former Smoker    Years: 35.00    Types: Pipe    Quit date: 02/21/1968    Years since quitting: 52.3  . Smokeless tobacco: Never Used  Substance Use Topics  . Alcohol use: Yes    Comment: rare  . Drug use: No     Family Hx: The patient's family history is not on file. He was adopted.  ROS:   Please see the history of present illness.     All other systems reviewed and are negative.   Prior CV studies:   The following studies were reviewed today:  Echo 04/09/2019 1. The left ventricle has moderate-severely reduced systolic function,  with an ejection fraction of 30-35%. The cavity size was severely dilated.  Left ventricular diastolic function could not be evaluated secondary to  atrial fibrillation. Left  ventricular diffuse hypokinesis.  2. The right ventricle has normal systolic function. The cavity was  normal. There is no increase in right ventricular wall thickness. Right  ventricular  systolic pressure is moderately elevated.  3. The aortic valve is tricuspid. Moderate sclerosis of the aortic valve.  Aortic valve regurgitation is mild by color flow Doppler.  4. The aorta is abnormal unless otherwise noted.  5. There is mild dilatation of the aortic root and of the ascending aorta  measuring 39 mm and 3mm respectively.  6. Mitral valve regurgitation is mild to moderate by color flow Doppler.  7. Left atrial size was mildly dilated.  8. The inferior vena cava was dilated in size with >50% respiratory  variability.   Labs/Other Tests and Data Reviewed:    EKG:  An ECG dated 08/02/2019 was personally reviewed today and demonstrated:  Sinus rhythm with left bundle branch block and occasional PVC.  Recent Labs: 08/02/2019: BUN 20; Creatinine, Ser 1.06; Hemoglobin 13.3; NT-Pro BNP 1,910; Platelets 227; Potassium 4.1; Sodium 142; TSH 3.090   Recent Lipid Panel Lab Results  Component Value Date/Time   CHOL 105 (L) 03/11/2016 02:19 PM   CHOL 131 03/07/2015 03:14 PM   TRIG 81 03/11/2016 02:19 PM   TRIG 130 03/07/2015 03:14 PM   HDL 45 03/11/2016 02:19 PM   HDL 41 03/07/2015 03:14 PM   CHOLHDL 2.3 03/11/2016 02:19 PM   LDLCALC 44 03/11/2016 02:19 PM   LDLCALC 64 03/07/2015 03:14 PM    Wt Readings from Last 3 Encounters:  06/05/20 200 lb (90.7 kg)  12/01/19 217 lb 9.6 oz (98.7 kg)  08/02/19 221 lb (100.2 kg)     Risk Assessment/Calculations:     CHA2DS2-VASc Score = 6  This indicates a 9.7% annual risk of stroke. The patient's score is based upon: CHF History: 1 HTN History: 1 Diabetes History: 1 Stroke History: 0 Vascular Disease History: 1 Age Score: 2 Gender Score: 0     Objective:    Vital Signs:  BP 113/64   Ht 6\' 3"  (1.905 m)   Wt 200 lb (90.7 kg)   BMI 25.00 kg/m    VITAL SIGNS:  reviewed  ASSESSMENT & PLAN:    1. Multivessel CAD: Denies any recent chest pain.  Coronary artery disease stable on most recent cardiac catheterization  in July 2021.  Continue on aspirin and Effient  2. Ischemic cardiomyopathy s/p CRT-D: Followed by electrophysiology service at [facility].  On carvedilol and Delene Loll  3. Moderate to severe MR: Given asymptomatic nature, no need for urgent valve surgery.  We will continue monitor.  May need to consider repeat echocardiogram every 82-month  4. History of atrial fibrillation s/p ablation: No recent recurrence.  Continue amiodarone  5. Hypertension: Blood pressure stable on current therapy  6. Hyperlipidemia: On Crestor  7. DM2: Managed by primary care provider  COVID-19 Education: The signs and symptoms of COVID-19 were discussed with the patient and how to seek care for testing (follow up with PCP or arrange E-visit).  The importance of social distancing was discussed today.  Time:   Today, I have spent 10 minutes with the patient with telehealth technology discussing the above problems.     Medication Adjustments/Labs and Tests Ordered: Current medicines are reviewed at length with the patient today.  Concerns regarding medicines are outlined above.   Tests Ordered: No orders of the defined types were placed in this encounter.   Medication Changes: No orders of the defined types were placed in this encounter.   Follow Up:  In Person in 6 month(s)  Signed, Almyra Deforest, [place]  06/07/2020 11:37 PM    [facility]

## 2020-06-05 NOTE — Telephone Encounter (Signed)
  Patient Consent for Virtual Visit         Edward [name] has provided verbal consent on 06/05/2020 for a virtual visit (video or telephone).   CONSENT FOR VIRTUAL VISIT FOR:  Edward [name]  By participating in this virtual visit I agree to the following:  I hereby voluntarily request, consent and authorize [facility] and its employed or contracted physicians, physician assistants, nurse practitioners or other licensed health care professionals (the Practitioner), to provide me with telemedicine health care services (the "Services") as deemed necessary by the treating Practitioner. I acknowledge and consent to receive the Services by the Practitioner via telemedicine. I understand that the telemedicine visit will involve communicating with the Practitioner through live audiovisual communication technology and the disclosure of certain medical information by electronic transmission. I acknowledge that I have been given the opportunity to request an in-person assessment or other available alternative prior to the telemedicine visit and am voluntarily participating in the telemedicine visit.  I understand that I have the right to withhold or withdraw my consent to the use of telemedicine in the course of my care at any time, without affecting my right to future care or treatment, and that the Practitioner or I may terminate the telemedicine visit at any time. I understand that I have the right to inspect all information obtained and/or recorded in the course of the telemedicine visit and may receive copies of available information for a reasonable fee.  I understand that some of the potential risks of receiving the Services via telemedicine include:  Marland Kitchen Delay or interruption in medical evaluation due to technological equipment failure or disruption; . Information transmitted may not be sufficient (e.g. poor resolution of images) to allow for appropriate medical decision making by the  Practitioner; and/or  . In rare instances, security protocols could fail, causing a breach of personal health information.  Furthermore, I acknowledge that it is my responsibility to provide information about my medical history, conditions and care that is complete and accurate to the best of my ability. I acknowledge that Practitioner's advice, recommendations, and/or decision may be based on factors not within their control, such as incomplete or inaccurate data provided by me or distortions of diagnostic images or specimens that may result from electronic transmissions. I understand that the practice of medicine is not an exact science and that Practitioner makes no warranties or guarantees regarding treatment outcomes. I acknowledge that a copy of this consent can be made available to me via my patient portal ([place]), or I can request a printed copy by calling the office of [place].    I understand that my insurance will be billed for this visit.   I have read or had this consent read to me. . I understand the contents of this consent, which adequately explains the benefits and risks of the Services being provided via telemedicine.  . I have been provided ample opportunity to ask questions regarding this consent and the Services and have had my questions answered to my satisfaction. . I give my informed consent for the services to be provided through the use of telemedicine in my medical care

## 2020-11-08 ENCOUNTER — Other Ambulatory Visit: Payer: Self-pay | Admitting: Internal Medicine

## 2021-05-25 ENCOUNTER — Other Ambulatory Visit: Payer: Self-pay | Admitting: Internal Medicine

## 2021-06-17 ENCOUNTER — Other Ambulatory Visit: Payer: Self-pay | Admitting: Internal Medicine

## 2021-08-26 ENCOUNTER — Other Ambulatory Visit: Payer: Self-pay | Admitting: Internal Medicine

## 2021-12-16 ENCOUNTER — Other Ambulatory Visit: Payer: Self-pay | Admitting: Internal Medicine

## 2021-12-31 ENCOUNTER — Encounter: Payer: Self-pay | Admitting: Gastroenterology

## 2022-01-31 ENCOUNTER — Encounter: Payer: Self-pay | Admitting: Gastroenterology

## 2022-03-11 ENCOUNTER — Encounter: Payer: Self-pay | Admitting: Gastroenterology

## 2022-03-11 ENCOUNTER — Ambulatory Visit (INDEPENDENT_AMBULATORY_CARE_PROVIDER_SITE_OTHER): Payer: Medicare Other | Admitting: Gastroenterology

## 2022-03-11 VITALS — BP 152/82 | HR 52 | Ht 75.0 in | Wt 230.4 lb

## 2022-03-11 DIAGNOSIS — D649 Anemia, unspecified: Secondary | ICD-10-CM

## 2022-03-11 DIAGNOSIS — G4733 Obstructive sleep apnea (adult) (pediatric): Secondary | ICD-10-CM | POA: Diagnosis not present

## 2022-03-11 DIAGNOSIS — Z1211 Encounter for screening for malignant neoplasm of colon: Secondary | ICD-10-CM | POA: Diagnosis not present

## 2022-03-11 DIAGNOSIS — Z9989 Dependence on other enabling machines and devices: Secondary | ICD-10-CM

## 2022-03-11 DIAGNOSIS — I447 Left bundle-branch block, unspecified: Secondary | ICD-10-CM | POA: Diagnosis not present

## 2022-03-11 DIAGNOSIS — I509 Heart failure, unspecified: Secondary | ICD-10-CM

## 2022-03-11 MED ORDER — CLENPIQ 10-3.5-12 MG-GM -GM/175ML PO SOLN: 1.0000 | Freq: Once | ORAL | 0 refills | Status: AC

## 2022-03-11 NOTE — Progress Notes (Signed)
Chief Complaint: Colon cancer screening   Referring Provider:     Eldridge Abrahams, FNP    HPI:     Edward [name] is a 76 y.o. 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  Has never had a polyp in the past and no recent GI sxs. Does have hx of intermittent anemia over the years. Does take OTC iron for 2 years. No prior IV iron or RBC transfusion. Was recommended by his physicians at [facility] to have EGD at time of colonoscopy. Otherwise, no UGI sxs.  Last EGD was 1998.  Was last seen at [facility] on 12/13/2021.  Currently takes ASA 81 mg, but no other antiplatelet/anticoagulation (stopped Effient in 09/2020).   Has cataract surgery Aug 22 and early Sept. He would like to schedule any endoscopic procedures for after that time.  Maintains very active lifestyle, walking 3-4 miles daily.  Endoscopic History: - EGD (01/1997): Normal - Flexible sigmoidoscopy (01/1997): Normal - Colonoscopy (09/2000): Normal - Colonoscopy (10/2011): Normal.  Repeat 10 years   Reviewed labs from 01/28/2022: - H/H 12.9/38.4, MCV 97.7, otherwise normal CBC - Normal CMP  Reviewed labs from 04/10/2021: - H/H 12.5/37.7 with MCV 96.7 - Ferritin 83, iron 94, TIBC 321, sat 29%   Past Medical History:  Diagnosis Date   Arrhythmia    Atrial fibrillation ([place])    PAF; ablation in 2009   Back pain    BPH (benign prostatic hyperplasia)    CAD (coronary artery disease)    Congestive heart failure (CHF) ([place])     History of nuclear stress test 11/11/2011   lexiscan; mild ischemia in apical inferior & apical lateral regions   Hyperlipidemia    Hypertension    Knee pain    bilat arthritis   OSA (obstructive sleep apnea)    CPAP; sleep study 06/2007 - AHI during total sleep 25.95/hr and during REM 32.57/hr (moderate sleep apnea); postural component w/severe sleep apnea during supine (AHI 76.36/hr)   Ruptured lumbar disc    SSS (sick sinus syndrome) ([place])      Past Surgical History:  Procedure Laterality Date   ATRIAL FIBRILLATION ABLATION  08/13/2007   [facility] - isthmius tricuspid abaltion w/no evidence of ABNRT   BACK SURGERY  08/13/1979   CARDIAC DEFIBRILLATOR PLACEMENT     COLONOSCOPY     KNEE SURGERY Bilateral 1961, 1989, 1991   3 surg on right/ 1 surg on left   NOSE SURGERY     couple surg   PACEMAKER IMPLANT     TRANSTHORACIC ECHOCARDIOGRAM  04/12/2012   EF=>55%; mod conc LVH; LA mod dilated; mild MR; trace TR; AV mildly sclerotic & mild regurg; aortic root sclerosis/calcif   Family History  Adopted: Yes   Social History   Tobacco Use   Smoking status: Former    Types: Pipe    Quit date: 02/21/1968    Years since quitting: 54.0   Smokeless tobacco: Never  Vaping Use   Vaping Use: Never used  Substance Use Topics  Alcohol use: Yes    Comment: rare   Drug use: No   Current Outpatient Medications  Medication Sig Dispense Refill   albuterol (VENTOLIN HFA) 108 (90 Base) MCG/ACT inhaler Inhale 2 puffs into the lungs every 6 (six) hours as needed.     amiodarone (PACERONE) 200 MG tablet Take 200 mg by mouth 2 (two) times daily.     aspirin EC 81 MG tablet Take 81 mg by mouth daily.     carvedilol (COREG) 3.125 MG tablet Take 3.125 mg by mouth 2 (two) times daily.     Coenzyme Q10 (CO Q 10) 100 MG CAPS Take 200 mg by mouth daily.     dapagliflozin propanediol (FARXIGA) 5 MG TABS tablet Take 1 tablet by mouth daily.     ENTRESTO 49-51 MG TAKE 1 TABLET BY MOUTH TWICE DAILY 60  tablet 6   fish oil-omega-3 fatty acids 1000 MG capsule Take 2 g by mouth daily.     fluticasone (FLONASE) 50 MCG/ACT nasal spray Place into the nose.     Ibuprofen (ADVIL PO) Take by mouth as needed.     Magnesium 400 MG CAPS Take 400 mg by mouth 2 (two) times daily.      Multiple Vitamin (MULTIVITAMIN) tablet Take 1 tablet by mouth daily.     potassium chloride SA (KLOR-CON) 20 MEQ tablet Take 40 mEq by mouth daily.      rosuvastatin (CRESTOR) 20 MG tablet TAKE 1 TABLET BY MOUTH DAILY 90 tablet 0   tadalafil (CIALIS) 5 MG tablet Take 5 mg by mouth daily in the afternoon.     tamsulosin (FLOMAX) 0.4 MG CAPS capsule Take 0.4 mg by mouth daily.     Current Facility-Administered Medications  Medication Dose Route Frequency Provider Last Rate Last Admin   0.9 %  sodium chloride infusion  500 mL Intravenous Continuous Sable Feil, MD       Allergies  Allergen Reactions   Beta Adrenergic Blockers    Betapace [Sotalol Hcl]      Review of Systems: All systems reviewed and negative except where noted in HPI.     Physical Exam:    Wt Readings from Last 3 Encounters:  03/11/22 230 lb 6 oz (104.5 kg)  06/05/20 200 lb (90.7 kg)  12/01/19 217 lb 9.6 oz (98.7 kg)    Ht '6\' 3"'$  (1.905 m)   Wt 230 lb 6 oz (104.5 kg)   BMI 28.79 kg/m  Constitutional:  Pleasant, in no acute distress. Psychiatric: Normal mood and affect. Behavior is normal. Skin: Skin is warm and dry. No rashes noted.   ASSESSMENT AND PLAN;   1) Colon cancer screening - Due for repeat colonoscopy for ongoing CRC screening.  Discussed current societal guidelines, to include recommended age,.  Given overall normal high level of activity, and strong patient preference, proceed with colonoscopy now - Schedule colonoscopy - Ok to resume ASA 81 mg in the perioperative setting - Hold iron 7 days prior to bowel preparation  2) Normocytic anemia - History of normocytic anemia, responsive to oral iron - Plan for upper  endoscopy to evaluate for PUD, gastritis, AVMs, or other sources of occult bleeding  3) CHF with reduced EF 4) Cardiomyopathy 5) CAD with prior stent placement 6) Thoracic aortic aneurysm 7) LBBB 8) OSA on CPAP - Schedule procedures to be done at [facility] due to elevated periprocedural risks -Obtain Cardiology clearance to proceed with EGD/colonoscopy  The indications, risks, and benefits of  EGD and colonoscopy were explained to the patient in detail. Risks include but are not limited to bleeding, perforation, adverse reaction to medications, and cardiopulmonary compromise. Sequelae include but are not limited to the possibility of surgery, hospitalization, and mortality. The patient verbalized understanding and wished to proceed. All questions answered, referred to scheduler and bowel prep ordered. Further recommendations pending results of the exam.      Lavena Bullion, DO, FACG  03/11/2022, 2:16 PM   [place]

## 2022-03-11 NOTE — Patient Instructions (Addendum)
If you are age 77 or older, your body mass index should be between 23-30. Your Body mass index is 28.79 kg/m. If this is out of the aforementioned range listed, please consider follow up with your Primary Care Provider. __________________________________________________________  The [facility] providers would like to encourage you to use [facility] to communicate with providers for non-urgent requests or questions.  Due to long hold times on the telephone, sending your provider a message by [facility] may be a faster and more efficient way to get a response.  Please allow 48 business hours for a response.  Please remember that this is for non-urgent requests.   Due to recent changes in healthcare laws, you may see the results of your imaging and laboratory studies on MyChart before your provider has had a chance to review them.  We understand that in some cases there may be results that are confusing or concerning to you. Not all laboratory results come back in the same time frame and the provider may be waiting for multiple results in order to interpret others.  Please give [place] 48 hours in order for your provider to thoroughly review all the results before contacting the office for clarification of your results.   We have sent the following medications to your pharmacy for you to pick up at your convenience: Clenpiq  Hold Iron 7 days prior to your procedure.  You have been scheduled for an endoscopy and colonoscopy at [facility] on 04/06/22 at 8:15 am. Please follow the written instructions given to you at your visit today. Please pick up your prep supplies at the pharmacy within the next 1-3 days. If you use inhalers (even only as needed), please bring them with you on the day of your procedure.    Thank you for choosing me and [facility].  Vito Cirigliano, D.O.

## 2022-03-12 ENCOUNTER — Telehealth: Payer: Self-pay

## 2022-03-12 NOTE — Telephone Encounter (Signed)
Pt agreeable to IN OFFICE appt for pre op clearance. Pt akes to schedule appt in 05/2022. Pt is set to see Fabian Sharp, [facility] 05/13/22 @ 1:55. Pt thanked me for the call and the help today. I will send FYI to requesting office the pt has appt 05/13/22.

## 2022-03-12 NOTE — Telephone Encounter (Signed)
[facility] Pre-operative Risk Assessment     Request for surgical clearance:     Endoscopy Procedure   What type of surgery is being performed?     colonoscopy  When is this surgery scheduled?     06/06/22  What type of clearance is required ?   Medical  Are there any medications that need to be held prior to surgery and how long? No  Practice name and name of physician performing surgery?  [facility] Dr. Bryan Lemma  What is your office phone and fax number?      Phone- [phone]  [phone]  Anesthesia type (None, local, MAC, general) ?       MAC

## 2022-03-12 NOTE — Telephone Encounter (Signed)
   Name: Edward [name]  DOB: [birth year]  MRN: [id number]  Primary Cardiologist: Pixie Casino, MD  Chart reviewed as part of pre-operative protocol coverage. Because of Edward [name] past medical history and time since last visit, he will require a follow-up in-office visit in order to better assess preoperative cardiovascular risk.  Pre-op covering staff: - Please schedule appointment and call patient to inform them. If patient already had an upcoming appointment within acceptable timeframe, please add "pre-op clearance" to the appointment notes so provider is aware. - Please contact requesting surgeon's office via preferred method (i.e, phone, fax) to inform them of need for appointment prior to surgery.   Mable Fill, Marissa Nestle, NP  03/12/2022, 9:06 AM

## 2022-03-12 NOTE — Telephone Encounter (Signed)
[facility] Pre-operative Risk Assessment     Request for surgical clearance:     Endoscopy Procedure  What type of surgery is being performed?     Colonoscopy and Endoscopy  When is this surgery scheduled?     03/13/22  What type of clearance is required ? Medical  Are there any medications that need to be held prior to surgery and how long? No  Practice name and name of physician performing surgery?     [facility]  What is your office phone and fax number?      Phone- [phone]  [phone]  Anesthesia type (None, local, MAC, general) ?       MAC

## 2022-03-18 ENCOUNTER — Ambulatory Visit: Payer: Medicare Other | Admitting: Diagnostic Neuroimaging

## 2022-03-26 ENCOUNTER — Other Ambulatory Visit: Payer: Self-pay | Admitting: Internal Medicine

## 2022-05-10 NOTE — Progress Notes (Deleted)
Cardiology Office Note:    Date:  05/10/2022   ID:  Edward [name], DOB April 08, 1946, MRN [id number]  PCP:  Edward [name] Cardiologist:  Edward Casino, MD { Click to update primary MD,subspecialty MD or APP then REFRESH:1}    Referring MD: Edward [name]   No chief complaint on file. ***  History of Present Illness:    Edward [name] is a 76 y.o. male with a hx of     He presents today for preoperative risk evaluation prior to colonoscopy.    Past Medical History:  Diagnosis Date   Arrhythmia    Atrial fibrillation ([place])    PAF; ablation in 2009   Back pain    BPH (benign prostatic hyperplasia)    CAD (coronary artery disease)    Congestive heart failure (CHF) ([place])    History of nuclear stress test 11/11/2011   lexiscan; mild ischemia in apical inferior & apical lateral regions   Hyperlipidemia    Hypertension    Knee pain    bilat arthritis   OSA (obstructive sleep apnea)    CPAP; sleep study 06/2007 - AHI during total sleep 25.95/hr and during REM 32.57/hr (moderate sleep apnea); postural component w/severe sleep apnea during supine (AHI 76.36/hr)   Ruptured lumbar disc    SSS (sick sinus syndrome) ([place])     Past Surgical History:  Procedure Laterality Date   ATRIAL FIBRILLATION ABLATION  08/13/2007   [facility] - isthmius tricuspid abaltion w/no evidence of ABNRT   BACK SURGERY  08/13/1979   CARDIAC DEFIBRILLATOR PLACEMENT     COLONOSCOPY     KNEE SURGERY Bilateral 1961, 1989, 1991   3 surg on right/ 1 surg on left   NOSE SURGERY     couple surg   PACEMAKER IMPLANT     TRANSTHORACIC ECHOCARDIOGRAM  04/12/2012   EF=>55%; mod conc LVH; LA mod dilated; mild MR; trace TR; AV mildly sclerotic & mild regurg; aortic root sclerosis/calcif    Current Medications: No outpatient medications have been marked as taking for the 05/13/22 encounter (Appointment) with Edward [name], Edward [name].   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: [employer]  Tobacco Use   Smoking status: Former    Types: Pipe    Quit date: 02/21/1968    Years since quitting: 54.2   Smokeless tobacco: Never  Vaping Use   Vaping Use: Never used  Substance and Sexual Activity   Alcohol use: Yes    Comment: rare   Drug use: No   Sexual activity: Not on file  Other Topics Concern   Not on file  Social History Narrative   Not on file   Social Determinants of Health   Financial Resource Strain: Not on file  Food Insecurity: Not on file  Transportation Needs: Not on file  Physical Activity: Not on file  Stress: Not on file  Social  Connections: Not on file     Family History: The patient's ***family history is not on file. He was adopted.  ROS:   Please see the history of present illness.    *** All other systems reviewed and are negative.  EKGs/Labs/Other Studies Reviewed:    The following studies were reviewed today: ***  EKG:  EKG is *** ordered today.  The ekg ordered today demonstrates ***  Recent Labs: No results found for requested labs within last 365 days.  Recent Lipid Panel    Component Value Date/Time   CHOL 105 (L) 03/11/2016 1419   CHOL 131 03/07/2015 1514   TRIG 81 03/11/2016 1419   TRIG 130 03/07/2015 1514   HDL 45 03/11/2016 1419   HDL 41 03/07/2015 1514   CHOLHDL 2.3 03/11/2016 1419   VLDL 16 03/11/2016 1419   LDLCALC 44 03/11/2016 1419   LDLCALC 64 03/07/2015 1514      Risk Assessment/Calculations:   {Does this patient have ATRIAL FIBRILLATION?:[phone]}  No BP recorded.  {Refresh Note OR Click here to enter BP  :1}***         Physical Exam:    VS:  There were no vitals taken for this visit.    Wt Readings from Last 3 Encounters:  03/11/22 230 lb 6 oz (104.5 kg)  06/05/20 200 lb (90.7 kg)  12/01/19 217 lb 9.6 oz (98.7 kg)     GEN: *** Well nourished, well developed in no acute distress HEENT: Normal NECK: No JVD; No carotid bruits LYMPHATICS: No lymphadenopathy CARDIAC: ***RRR, no murmurs, rubs, gallops RESPIRATORY:  Clear to auscultation without rales, wheezing or rhonchi  ABDOMEN: Soft, non-tender, non-distended MUSCULOSKELETAL:  No edema; No deformity  SKIN: Warm and dry NEUROLOGIC:  Alert and oriented x 3 PSYCHIATRIC:  Normal affect   ASSESSMENT:    No diagnosis found. PLAN:    In order of problems listed above:  ***      {Are you ordering a CV Procedure (e.g. stress test, cath, DCCV, TEE, etc)?   Press F2        :[id number]}    Medication Adjustments/Labs and Tests Ordered: Current medicines are reviewed at length with the patient today.  Concerns regarding medicines are outlined above.  No orders of the defined types were placed in this encounter.  No orders of the defined types were placed in this encounter.   There are no Patient Instructions on file for this visit.   Signed, [place], PA  05/10/2022 1:08 PM    [facility]

## 2022-05-13 ENCOUNTER — Ambulatory Visit: Payer: Medicare Other | Admitting: Physician Assistant

## 2022-05-13 ENCOUNTER — Telehealth: Payer: Self-pay

## 2022-05-13 NOTE — Telephone Encounter (Signed)
New "Urgent" Pre-op cardiac clearance faxed to Dr. Blane Ohara office.Patient called stating that his appointment today with Fabian Sharp, PA for pre op cardiac clearance got cancelled. Stated he was told to get his Clearance from [facility] cardiology since he is already established with them and was recently seen by Dr. Ola Spurr on 04/17/22.

## 2022-05-17 NOTE — Telephone Encounter (Signed)
Received Pre-op cardiac clearance from Dr. Ola Spurr. Patent is cleared for surgery. Sent to scan to patient's chart.

## 2022-05-29 ENCOUNTER — Encounter (HOSPITAL_COMMUNITY): Payer: Self-pay | Admitting: Gastroenterology

## 2022-05-30 NOTE — Progress Notes (Signed)
Attempted to obtain medical history via telephone, unable to reach at this time. HIPAA compliant voicemail message left requesting return call to pre surgical testing department. 

## 2022-06-06 ENCOUNTER — Ambulatory Visit (HOSPITAL_COMMUNITY)
Admission: RE | Admit: 2022-06-06 | Discharge: 2022-06-06 | Disposition: A | Discharge status: Home / Self Care | Payer: Medicare Other | Attending: Gastroenterology | Admitting: Gastroenterology

## 2022-06-06 ENCOUNTER — Ambulatory Visit (HOSPITAL_COMMUNITY): Payer: Medicare Other | Admitting: Anesthesiology

## 2022-06-06 ENCOUNTER — Encounter (HOSPITAL_COMMUNITY)
Admission: RE | Disposition: A | Discharge status: Home / Self Care | Payer: Self-pay | Source: Home / Self Care | Attending: Gastroenterology

## 2022-06-06 ENCOUNTER — Ambulatory Visit (HOSPITAL_BASED_OUTPATIENT_CLINIC_OR_DEPARTMENT_OTHER): Payer: Medicare Other | Admitting: Anesthesiology

## 2022-06-06 ENCOUNTER — Encounter (HOSPITAL_COMMUNITY): Payer: Self-pay | Admitting: Gastroenterology

## 2022-06-06 ENCOUNTER — Other Ambulatory Visit: Payer: Self-pay

## 2022-06-06 DIAGNOSIS — Z87891 Personal history of nicotine dependence: Secondary | ICD-10-CM | POA: Diagnosis not present

## 2022-06-06 DIAGNOSIS — I110 Hypertensive heart disease with heart failure: Secondary | ICD-10-CM | POA: Diagnosis not present

## 2022-06-06 DIAGNOSIS — I2510 Atherosclerotic heart disease of native coronary artery without angina pectoris: Secondary | ICD-10-CM | POA: Insufficient documentation

## 2022-06-06 DIAGNOSIS — G4733 Obstructive sleep apnea (adult) (pediatric): Secondary | ICD-10-CM | POA: Diagnosis not present

## 2022-06-06 DIAGNOSIS — G4730 Sleep apnea, unspecified: Secondary | ICD-10-CM

## 2022-06-06 DIAGNOSIS — Z1212 Encounter for screening for malignant neoplasm of rectum: Secondary | ICD-10-CM

## 2022-06-06 DIAGNOSIS — K635 Polyp of colon: Secondary | ICD-10-CM | POA: Diagnosis not present

## 2022-06-06 DIAGNOSIS — Z1211 Encounter for screening for malignant neoplasm of colon: Secondary | ICD-10-CM | POA: Insufficient documentation

## 2022-06-06 DIAGNOSIS — K319 Disease of stomach and duodenum, unspecified: Secondary | ICD-10-CM | POA: Insufficient documentation

## 2022-06-06 DIAGNOSIS — D649 Anemia, unspecified: Secondary | ICD-10-CM | POA: Diagnosis not present

## 2022-06-06 DIAGNOSIS — Z95810 Presence of automatic (implantable) cardiac defibrillator: Secondary | ICD-10-CM | POA: Insufficient documentation

## 2022-06-06 DIAGNOSIS — K269 Duodenal ulcer, unspecified as acute or chronic, without hemorrhage or perforation: Secondary | ICD-10-CM

## 2022-06-06 DIAGNOSIS — D509 Iron deficiency anemia, unspecified: Secondary | ICD-10-CM

## 2022-06-06 DIAGNOSIS — I5022 Chronic systolic (congestive) heart failure: Secondary | ICD-10-CM | POA: Insufficient documentation

## 2022-06-06 DIAGNOSIS — D123 Benign neoplasm of transverse colon: Secondary | ICD-10-CM | POA: Diagnosis not present

## 2022-06-06 HISTORY — DX: Presence of cardiac pacemaker: Z95.0

## 2022-06-06 HISTORY — DX: Presence of automatic (implantable) cardiac defibrillator: Z95.810

## 2022-06-06 HISTORY — PX: BIOPSY: SHX5522

## 2022-06-06 HISTORY — PX: ESOPHAGOGASTRODUODENOSCOPY (EGD) WITH PROPOFOL: SHX5813

## 2022-06-06 HISTORY — PX: COLONOSCOPY WITH PROPOFOL: SHX5780

## 2022-06-06 HISTORY — PX: POLYPECTOMY: SHX5525

## 2022-06-06 SURGERY — COLONOSCOPY WITH PROPOFOL
Anesthesia: Monitor Anesthesia Care

## 2022-06-06 MED ORDER — LACTATED RINGERS IV SOLN
INTRAVENOUS | Status: AC | PRN
  Administered 2022-06-06: 10 mL/h via INTRAVENOUS

## 2022-06-06 MED ORDER — SODIUM CHLORIDE 0.9 % IV SOLN: INTRAVENOUS | Status: DC

## 2022-06-06 MED ORDER — PROPOFOL 1000 MG/100ML IV EMUL
INTRAVENOUS | Status: AC
  Filled 2022-06-06: qty 100

## 2022-06-06 MED ORDER — PROPOFOL 500 MG/50ML IV EMUL
INTRAVENOUS | Status: DC | PRN
  Administered 2022-06-06: 125 ug/kg/min via INTRAVENOUS

## 2022-06-06 MED ORDER — VITAMIN D3 1000 UNITS PO CAPS: 1000.0000 mg | ORAL_CAPSULE | Freq: Every day | ORAL | 0 refills | Status: AC

## 2022-06-06 MED ORDER — PANTOPRAZOLE SODIUM 40 MG PO TBEC: 40.0000 mg | DELAYED_RELEASE_TABLET | Freq: Two times a day (BID) | ORAL | 1 refills | Status: AC

## 2022-06-06 MED ORDER — PROPOFOL 10 MG/ML IV BOLUS
INTRAVENOUS | Status: DC | PRN
  Administered 2022-06-06: 20 mg via INTRAVENOUS
  Administered 2022-06-06 (×2): 30 mg via INTRAVENOUS

## 2022-06-06 SURGICAL SUPPLY — 25 items

## 2022-06-06 NOTE — H&P (Signed)
GASTROENTEROLOGY PROCEDURE H&P NOTE   Primary Care Physician: Berkley Harvey, NP    Reason for Procedure:  Colon cancer screening, normocytic anemia  Plan:    EGD, colonoscopy  Patient is appropriate for endoscopic procedure(s) at [facility] Endoscopy unit.  Procedures scheduled at [facility] Endoscopy unit due to elevated periprocedural risks from underlying comorbidities, to include CHF, OSA.  The nature of the procedure, as well as the risks, benefits, and alternatives were carefully and thoroughly reviewed with the patient. Ample time for discussion and questions allowed. The patient understood, was satisfied, and agreed to proceed.     HPI: Edward [name] is a 76 y.o. male who presents for EGD and colonoscopy for evaluation of normocytic anemia and colon cancer screening.  Procedures scheduled at [facility] due to elevated periprocedural risks from underlying comorbidities, to include CHF with reduced EF, cardiomyopathy, CAD, OSA.  Past Medical History:  Diagnosis Date   AICD (automatic cardioverter/defibrillator) present    Arrhythmia    Atrial fibrillation ([place])    PAF; ablation in 2009   Back pain    BPH (benign prostatic hyperplasia)    CAD (coronary artery disease)    Congestive heart failure (CHF) ([place])    History of nuclear stress test 11/11/2011   lexiscan; mild ischemia in apical inferior & apical lateral regions   Hyperlipidemia    Hypertension    Knee pain    bilat arthritis   OSA (obstructive sleep apnea)    CPAP; sleep study 06/2007 - AHI during total sleep 25.95/hr and during REM 32.57/hr (moderate sleep apnea); postural component w/severe sleep apnea during supine (AHI 76.36/hr)   Presence of permanent cardiac pacemaker    Ruptured lumbar disc    SSS (sick sinus syndrome) ([place])     Past Surgical History:  Procedure Laterality Date   ATRIAL FIBRILLATION ABLATION  08/13/2007   [facility] - isthmius tricuspid abaltion w/no evidence of ABNRT    BACK SURGERY  08/13/1979   CARDIAC DEFIBRILLATOR PLACEMENT     COLONOSCOPY     KNEE SURGERY Bilateral 1961, 1989, 1991   3 surg on right/ 1 surg on left   NOSE SURGERY     couple surg   PACEMAKER IMPLANT     TRANSTHORACIC ECHOCARDIOGRAM  04/12/2012   EF=>55%; mod conc LVH; LA mod dilated; mild MR; trace TR; AV mildly sclerotic & mild regurg; aortic root sclerosis/calcif    Prior to Admission medications   Medication Sig Start Date End Date Taking? Authorizing Provider  ACETYLCARNITINE HCL PO Take 800 mg by mouth daily.   Yes [provider]  amiodarone (PACERONE) 200 MG tablet Take 200 mg by mouth daily. 11/22/19  Yes [provider]  aspirin EC 81 MG tablet Take 81 mg by mouth daily.   Yes [provider]  carvedilol (COREG) 3.125 MG tablet Take 3.125 mg by mouth 2 (two) times daily. 05/07/20  Yes [provider]  Cholecalciferol (VITAMIN D3 PO) Take 8,000 mg by mouth daily.   Yes [provider]  Coenzyme Q10 (CO Q 10) 100 MG CAPS Take 300 mg by mouth daily. 100 mg and 200 mg tablets   Yes [provider]  COLLAGEN PO Take 3 tablets by mouth daily.   Yes [provider]  dapagliflozin propanediol (FARXIGA) 5 MG TABS tablet Take 5 mg by mouth daily. 05/08/20  Yes [provider]  ENTRESTO 49-51 MG TAKE 1 TABLET BY MOUTH TWICE DAILY 06/18/21  Yes [name],  Nadean Corwin, MD  Ferrous Sulfate (IRON PO) Take 25 mg by mouth daily.   Yes [provider]  fish oil-omega-3 fatty acids 1000 MG capsule Take 2 g by mouth daily.   Yes [provider]  fluticasone (FLONASE) 50 MCG/ACT nasal spray Place 2 sprays into both nostrils daily as needed for allergies.   Yes [provider]  Magnesium 400 MG CAPS Take 400 mg by mouth daily.   Yes [provider]  Multiple Vitamin (MULTIVITAMIN) tablet Take 1 tablet by mouth daily.   Yes [provider]  Polyethyl Glycol-Propyl Glycol (SYSTANE) 0.4-0.3  % SOLN Place 1 drop into both eyes daily as needed (dry eyes).   Yes [provider]  potassium chloride SA (KLOR-CON) 20 MEQ tablet Take 20 mEq by mouth daily. 10/11/19  Yes [provider]  rosuvastatin (CRESTOR) 20 MG tablet Take 1 tablet (20 mg total) by mouth daily. KEEP UPCOMING APPOINTMENT FOR FUTURE REFILLS. Patient taking differently: Take 20 mg by mouth every other day. KEEP UPCOMING APPOINTMENT FOR FUTURE REFILLS. 03/26/22  Yes [name], Nadean Corwin, MD  tadalafil (CIALIS) 5 MG tablet Take 5 mg by mouth daily in the afternoon.   Yes [provider]    Current Facility-Administered Medications  Medication Dose Route Frequency Provider Last Rate Last Admin   0.9 %  sodium chloride infusion   Intravenous Continuous Kenniya Westrich V, DO        Allergies as of 03/11/2022 - Review Complete 03/11/2022  Allergen Reaction Noted   Beta adrenergic blockers  06/16/2013   Betapace [sotalol hcl]  06/16/2013    Family History  Adopted: Yes    Social History   Socioeconomic History   Marital status: Married    Spouse name: Not on file   Number of children: 1   Years of education: college   Highest education level: Not on file  Occupational History   Occupation: retired    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[employer]  Tobacco Use   Smoking status: Former    Types: Pipe    Quit date: 02/21/1968    Years since quitting: 54.3   Smokeless tobacco: Never  Vaping Use   Vaping Use: Never used  Substance and Sexual Activity   Alcohol use: Yes    Comment: rare   Drug use: No   Sexual activity: Not on file  Other Topics Concern   Not on file  Social History Narrative   Not on file   Social Determinants of Health   Financial Resource Strain: Not on file  Food Insecurity: Not on file  Transportation Needs: Not on file  Physical Activity: Not on file  Stress: Not on file  Social Connections: Not on file   Intimate Partner Violence: Not on file    Physical Exam: Vital signs in last 24 hours: '@Wt'$  99.8 kg   BMI 27.50 kg/m  GEN: NAD EYE: Sclerae anicteric ENT: MMM CV: Non-tachycardic Pulm: CTA b/l GI: Soft, NT/ND NEURO:  Alert & Oriented x [place], DO [facility]   06/06/2022 7:34 AM

## 2022-06-06 NOTE — Op Note (Signed)
[facility] Patient Name: Edward [name] Procedure Date: 06/06/2022 MRN: [id number] Attending MD: Gerrit Heck , MD, [id number] Date of Birth: [birth year] CSN: [id number] Age: 76 Admit Type: Outpatient Procedure:                Colonoscopy Indications:              Screening for colorectal malignant neoplasm (last                            colonoscopy was 10 years ago) Providers:                Gerrit Heck, MD, Jaci Carrel, RN, Frazier Richards, Technician Referring MD:              Medicines:                Monitored Anesthesia Care Complications:            No immediate complications. Estimated Blood Loss:     Estimated blood loss was minimal. Procedure:                Pre-Anesthesia Assessment:                           - Prior to the procedure, a History and Physical                            was performed, and patient medications and                            allergies were reviewed. The patient's tolerance of                            previous anesthesia was also reviewed. The risks                            and benefits of the procedure and the sedation                            options and risks were discussed with the patient.                            All questions were answered, and informed consent                            was obtained. Prior Anticoagulants: The patient has                            taken no anticoagulant or antiplatelet agents                            except for aspirin. ASA Grade Assessment: III - A  patient with severe systemic disease. After                            reviewing the risks and benefits, the patient was                            deemed in satisfactory condition to undergo the                            procedure.                           After obtaining informed consent, the colonoscope                            was passed under direct vision.  Throughout the                            procedure, the patient's blood pressure, pulse, and                            oxygen saturations were monitored continuously. The                            CF-HQ190L ([id number] Olympus colonoscope was                            introduced through the anus and advanced to the the                            cecum, identified by appendiceal orifice and                            ileocecal valve. The colonoscopy was performed                            without difficulty. The patient tolerated the                            procedure well. The quality of the bowel                            preparation was good. The ileocecal valve,                            appendiceal orifice, and rectum were photographed. Scope In: 8:36:52 AM Scope Out: 8:51:27 AM Scope Withdrawal Time: 0 hours 10 minutes 59 seconds  Total Procedure Duration: 0 hours 14 minutes 35 seconds  Findings:      The perianal and digital rectal examinations were normal.      A 5 mm polyp was found in the transverse colon. The polyp was sessile.       The polyp was removed with a cold snare. Resection and retrieval were       complete. Estimated blood loss was minimal.  The exam was otherwise normal throughout the remainder of the colon.      Retroflexion in the right colon was performed.      The retroflexed view of the distal rectum and anal verge was normal and       showed no anal or rectal abnormalities. Impression:               - One 5 mm polyp in the transverse colon, removed                            with a cold snare. Resected and retrieved.                           - Otherwise normal appearing colon.                           - The distal rectum and anal verge are normal on                            retroflexion view. Moderate Sedation:      Not Applicable - Patient had care per Anesthesia. Recommendation:           - Patient has a contact number available for                             emergencies. The signs and symptoms of potential                            delayed complications were discussed with the                            patient. Return to normal activities tomorrow.                            Written discharge instructions were provided to the                            patient.                           - Resume previous diet.                           - Continue present medications.                           - Await pathology results.                           - Depending on pathology results, can consider                            discontinuation of future screening/surveillance                            colonoscopy based on findings today, age, and  chronic medical conditions.                           - Return to GI office PRN. Procedure Code(s):        --- Professional ---                           [phone], Colonoscopy, flexible; with removal of                            tumor(s), polyp(s), or other lesion(s) by snare                            technique Diagnosis Code(s):        --- Professional ---                           Z12.11, Encounter for screening for malignant                            neoplasm of colon                           D12.3, Benign neoplasm of transverse colon (hepatic                            flexure or splenic flexure) CPT copyright 2022 American Medical Association. All rights reserved. The codes documented in this report are preliminary and upon coder review may  be revised to meet current compliance requirements. Gerrit Heck, MD 06/06/2022 9:04:36 AM Number of Addenda: 0

## 2022-06-06 NOTE — Transfer of Care (Signed)
Immediate Anesthesia Transfer of Care Note  Patient: Edward [name]  Procedure(s) Performed: COLONOSCOPY WITH PROPOFOL ESOPHAGOGASTRODUODENOSCOPY (EGD) WITH PROPOFOL BIOPSY POLYPECTOMY  Patient Location: Endoscopy Unit  Anesthesia Type:MAC  Level of Consciousness: awake and patient cooperative  Airway & Oxygen Therapy: Patient Spontanous Breathing and Patient connected to face mask  Post-op Assessment: Report given to RN and Post -op Vital signs reviewed and stable  Post vital signs: Reviewed and stable  Last Vitals:  Vitals Value Taken Time  BP 147/68 06/06/22 0900  Temp    Pulse 59 06/06/22 0900  Resp 10 06/06/22 0900  SpO2 100 % 06/06/22 0900  Vitals shown include unvalidated device data.  Last Pain:  Vitals:   06/06/22 0744  TempSrc: Temporal  PainSc: 0-No pain         Complications: No notable events documented.

## 2022-06-06 NOTE — Anesthesia Postprocedure Evaluation (Signed)
Anesthesia Post Note  Patient: Edward [name]  Procedure(s) Performed: COLONOSCOPY WITH PROPOFOL ESOPHAGOGASTRODUODENOSCOPY (EGD) WITH PROPOFOL BIOPSY POLYPECTOMY     Patient location during evaluation: PACU Anesthesia Type: MAC Level of consciousness: awake and alert Pain management: pain level controlled Vital Signs Assessment: post-procedure vital signs reviewed and stable Respiratory status: spontaneous breathing, nonlabored ventilation and respiratory function stable Cardiovascular status: blood pressure returned to baseline and stable Postop Assessment: no apparent nausea or vomiting Anesthetic complications: no   No notable events documented.  Last Vitals:  Vitals:   06/06/22 0911 06/06/22 0921  BP: (!) 155/80 (!) 171/77  Pulse: (!) 58 (!) 49  Resp: 20 11  Temp:    SpO2: 100% 99%    Last Pain:  Vitals:   06/06/22 0921  TempSrc:   PainSc: 0-No pain                 Lynda Rainwater

## 2022-06-06 NOTE — Anesthesia Preprocedure Evaluation (Signed)
Anesthesia Evaluation  Patient identified by MRN, date of birth, ID band Patient awake    Reviewed: Allergy & Precautions, NPO status , Patient's Chart, lab work & pertinent test results  Airway Mallampati: II  TM Distance: >3 FB Neck ROM: Full    Dental no notable dental hx.    Pulmonary sleep apnea , former smoker,    Pulmonary exam normal breath sounds clear to auscultation       Cardiovascular hypertension, Pt. on medications + CAD and +CHF  Normal cardiovascular exam+ pacemaker + Cardiac Defibrillator  Rhythm:Regular Rate:Normal     Neuro/Psych negative neurological ROS  negative psych ROS   GI/Hepatic negative GI ROS, Neg liver ROS,   Endo/Other  negative endocrine ROS  Renal/GU negative Renal ROS  negative genitourinary   Musculoskeletal negative musculoskeletal ROS (+)   Abdominal   Peds negative pediatric ROS (+)  Hematology negative hematology ROS (+)   Anesthesia Other Findings   Reproductive/Obstetrics negative OB ROS                             Anesthesia Physical Anesthesia Plan  ASA: 3  Anesthesia Plan: MAC   Post-op Pain Management: Minimal or no pain anticipated   Induction: Intravenous  PONV Risk Score and Plan: 1 and Ondansetron and Treatment may vary due to age or medical condition  Airway Management Planned: Nasal Cannula  Additional Equipment:   Intra-op Plan:   Post-operative Plan:   Informed Consent: I have reviewed the patients History and Physical, chart, labs and discussed the procedure including the risks, benefits and alternatives for the proposed anesthesia with the patient or authorized representative who has indicated his/her understanding and acceptance.     Dental advisory given  Plan Discussed with: CRNA  Anesthesia Plan Comments:         Anesthesia Quick Evaluation

## 2022-06-06 NOTE — Discharge Instructions (Signed)
YOU HAD AN ENDOSCOPIC PROCEDURE TODAY: Refer to the procedure report and other information in the discharge instructions given to you for any specific questions about what was found during the examination. If this information does not answer your questions, please call [place] office at [phone] to clarify.  ° °YOU SHOULD EXPECT: Some feelings of bloating in the abdomen. Passage of more gas than usual. Walking can help get rid of the air that was put into your GI tract during the procedure and reduce the bloating. If you had a lower endoscopy (such as a colonoscopy or flexible sigmoidoscopy) you may notice spotting of blood in your stool or on the toilet paper. Some abdominal soreness may be present for a day or two, also. ° °DIET: Your first meal following the procedure should be a light meal and then it is ok to progress to your normal diet. A half-sandwich or bowl of soup is an example of a good first meal. Heavy or fried foods are harder to digest and may make you feel nauseous or bloated. Drink plenty of fluids but you should avoid alcoholic beverages for 24 hours.  ° °ACTIVITY: Your care partner should take you home directly after the procedure. You should plan to take it easy, moving slowly for the rest of the day. You can resume normal activity the day after the procedure however YOU SHOULD NOT DRIVE, use power tools, machinery or perform tasks that involve climbing or major physical exertion for 24 hours (because of the sedation medicines used during the test).  ° °SYMPTOMS TO REPORT IMMEDIATELY: °A gastroenterologist can be reached at any hour. Please call [phone]  for any of the following symptoms:  °Following lower endoscopy (colonoscopy, flexible sigmoidoscopy) °Excessive amounts of blood in the stool  °Significant tenderness, worsening of abdominal pains  °Swelling of the abdomen that is new, acute  °Fever of 100° or higher  °Following upper endoscopy (EGD, EUS, ERCP, esophageal  dilation) °Vomiting of blood or coffee ground material  °New, significant abdominal pain  °New, significant chest pain or pain under the shoulder blades  °Painful or persistently difficult swallowing  °New shortness of breath  °Black, tarry-looking or red, bloody stools ° °FOLLOW UP:  °If any biopsies were taken you will be contacted by phone or by letter within the next 1-3 weeks. Call [phone]  if you have not heard about the biopsies in 3 weeks.  °Please also call with any specific questions about appointments or follow up tests.  °

## 2022-06-06 NOTE — Anesthesia Procedure Notes (Signed)
Procedure Name: MAC Date/Time: 06/06/2022 8:20 AM  Performed by: Claudia Desanctis, CRNAPre-anesthesia Checklist: Patient identified, Emergency Drugs available, Suction available and Patient being monitored Patient Re-evaluated:Patient Re-evaluated prior to induction Oxygen Delivery Method: Simple face mask

## 2022-06-06 NOTE — Interval H&P Note (Signed)
History and Physical Interval Note:  06/06/2022 8:10 AM  Edward [name]  has presented today for surgery, with the diagnosis of colon cancer screening, normocytic anemia.  The various methods of treatment have been discussed with the patient and family. After consideration of risks, benefits and other options for treatment, the patient has consented to  Procedure(s) with comments: COLONOSCOPY WITH PROPOFOL (N/A) ESOPHAGOGASTRODUODENOSCOPY (EGD) WITH PROPOFOL (N/A) - colon cancer screening, normocytic anemia as a surgical intervention.  The patient's history has been reviewed, patient examined, no change in status, stable for surgery.  I have reviewed the patient's chart and labs.  Questions were answered to the patient's satisfaction.     Dominic Pea Rosaland Shiffman

## 2022-06-06 NOTE — Op Note (Signed)
[facility] Patient Name: Edward [name] Procedure Date: 06/06/2022 MRN: [id number] Attending MD: Edward Heck , MD, [id number] Date of Birth: 04/04/1946 CSN: [id number] Age: 76 Admit Type: Outpatient Procedure:                Upper GI endoscopy Indications:              Chronic, normocytic anemia, improved with oral iron. Providers:                Edward Heck, MD, Jaci Carrel, RN, Frazier Richards, Technician Referring MD:              Medicines:                Monitored Anesthesia Care Complications:            No immediate complications. Estimated Blood Loss:     Estimated blood loss was minimal. Procedure:                Pre-Anesthesia Assessment:                           - Prior to the procedure, a History and Physical                            was performed, and patient medications and                            allergies were reviewed. The patient's tolerance of                            previous anesthesia was also reviewed. The risks                            and benefits of the procedure and the sedation                            options and risks were discussed with the patient.                            All questions were answered, and informed consent                            was obtained. Prior Anticoagulants: The patient has                            taken no anticoagulant or antiplatelet agents                            except for aspirin. ASA Grade Assessment: III - A                            patient with severe systemic disease. After  reviewing the risks and benefits, the patient was                            deemed in satisfactory condition to undergo the                            procedure.                           After obtaining informed consent, the endoscope was                            passed under direct vision. Throughout the                            procedure,  the patient's blood pressure, pulse, and                            oxygen saturations were monitored continuously. The                            [id number]-H190 ([id number]) Olympus endoscope was introduced                            through the mouth, and advanced to the second part                            of duodenum. The upper GI endoscopy was                            accomplished without difficulty. The patient                            tolerated the procedure well. Scope In: Scope Out: Findings:      The examined esophagus was normal.      The entire examined stomach was normal. Biopsies were taken with a cold       forceps for Helicobacter pylori testing. Estimated blood loss was       minimal.      Two non-bleeding superficial duodenal ulcers with no stigmata of       bleeding were found in the duodenal bulb. The largest lesion was 4 mm in       largest dimension. Biopsies were taken with a cold forceps for       histology. Estimated blood loss was minimal.      The second portion of the duodenum was normal. Biopsies were taken with       a cold forceps for histology. Estimated blood loss was minimal. Impression:               - Normal esophagus.                           - Normal stomach. Biopsied.                           - Non-bleeding duodenal ulcers with no stigmata  of                            bleeding. Biopsied.                           - Normal second portion of the duodenum. Biopsied. Moderate Sedation:      Not Applicable - Patient had care per Anesthesia. Recommendation:           - Patient has a contact number available for                            emergencies. The signs and symptoms of potential                            delayed complications were discussed with the                            patient. Return to normal activities tomorrow.                            Written discharge instructions were provided to the                            patient.                            - Resume previous diet.                           - Continue present medications.                           - Await pathology results.                           - Use Protonix (pantoprazole) 40 mg PO BID for 4                            weeks, then reduce to 40 mg daily x4 weeks, then                            can discontinue if no further need for chronic acid                            suppression therapy (ie, reflux, dyspepsia, etc).                           - Perform a colonoscopy today. Procedure Code(s):        --- Professional ---                           [phone], Esophagogastroduodenoscopy, flexible,                            transoral; with biopsy, single or multiple Diagnosis Code(s):        ---  Professional ---                           K26.9, Duodenal ulcer, unspecified as acute or                            chronic, without hemorrhage or perforation                           D50.9, Iron deficiency anemia, unspecified CPT copyright 2022 [employer] Association. All rights reserved. The codes documented in this report are preliminary and upon coder review may  be revised to meet current compliance requirements. Edward Heck, MD 06/06/2022 8:59:27 AM Number of Addenda: 0

## 2022-06-07 LAB — SURGICAL PATHOLOGY

## 2022-06-09 ENCOUNTER — Encounter (HOSPITAL_COMMUNITY): Payer: Self-pay | Admitting: Gastroenterology

## 2022-06-13 ENCOUNTER — Telehealth: Payer: Self-pay | Admitting: Gastroenterology

## 2022-06-13 NOTE — Telephone Encounter (Signed)
Pt states he started pantoprazole last Thursday and facial swelling just started today. Pt states swelling is more on the right side than the left. Pt denies SOB and rash. Pt reports that he has pain only when he chews on the right side. Pt also reports he has been more tired.

## 2022-06-13 NOTE — Telephone Encounter (Signed)
If he is having unilateral facial swelling and pain on the right side with chewing, I am not certain this is a medication reaction, but rather potentially local-regional inflammation. Possibly infectious. I recommend stopping the Pantoprazole and getting evaluation by [facility] tomorrow or Urgent Care/ER today. This sounds like something requiring in-person evaluation and exam.

## 2022-06-13 NOTE — Telephone Encounter (Signed)
Patient called stating he believes his Pantoprazole is causing his face to swell

## 2022-06-13 NOTE — Telephone Encounter (Signed)
Left detailed voicemail on patients phone.

## 2022-06-14 ENCOUNTER — Encounter (HOSPITAL_BASED_OUTPATIENT_CLINIC_OR_DEPARTMENT_OTHER): Payer: Self-pay | Admitting: Emergency Medicine

## 2022-06-14 ENCOUNTER — Emergency Department (HOSPITAL_BASED_OUTPATIENT_CLINIC_OR_DEPARTMENT_OTHER)
Admission: EM | Admit: 2022-06-14 | Discharge: 2022-06-14 | Disposition: A | Discharge status: Home / Self Care | Payer: Medicare Other | Attending: Emergency Medicine | Admitting: Emergency Medicine

## 2022-06-14 ENCOUNTER — Other Ambulatory Visit: Payer: Self-pay

## 2022-06-14 DIAGNOSIS — Z79899 Other long term (current) drug therapy: Secondary | ICD-10-CM | POA: Diagnosis not present

## 2022-06-14 DIAGNOSIS — R220 Localized swelling, mass and lump, head: Secondary | ICD-10-CM | POA: Diagnosis present

## 2022-06-14 DIAGNOSIS — Z7982 Long term (current) use of aspirin: Secondary | ICD-10-CM | POA: Diagnosis not present

## 2022-06-14 DIAGNOSIS — I509 Heart failure, unspecified: Secondary | ICD-10-CM | POA: Insufficient documentation

## 2022-06-14 DIAGNOSIS — Z87891 Personal history of nicotine dependence: Secondary | ICD-10-CM | POA: Insufficient documentation

## 2022-06-14 DIAGNOSIS — I110 Hypertensive heart disease with heart failure: Secondary | ICD-10-CM | POA: Diagnosis not present

## 2022-06-14 DIAGNOSIS — I2510 Atherosclerotic heart disease of native coronary artery without angina pectoris: Secondary | ICD-10-CM | POA: Insufficient documentation

## 2022-06-14 DIAGNOSIS — Z95810 Presence of automatic (implantable) cardiac defibrillator: Secondary | ICD-10-CM | POA: Diagnosis not present

## 2022-06-14 MED ORDER — DEXAMETHASONE 4 MG PO TABS
10.0000 mg | ORAL_TABLET | Freq: Once | ORAL | Status: AC
  Administered 2022-06-14: 10 mg via ORAL
  Filled 2022-06-14: qty 3

## 2022-06-14 NOTE — ED Triage Notes (Signed)
Pt reports BL facial swelling around jaw that started yesterday. Swelling is mostly resolved. Sent here by pcp for further eval due to possible allergic reaction to pantoprazole. Started taking the pantoprazole last Friday post-colonoscopy procedure for gastric ulcers. Pt did not take meds today.

## 2022-06-14 NOTE — Telephone Encounter (Signed)
Spoke with pt. Pt stated he is going to ER today.

## 2022-06-14 NOTE — ED Provider Notes (Signed)
[facility] EMERGENCY DEPARTMENT Provider Note   CSN: [id number] Arrival date & time: 06/14/22  1349     History  Chief Complaint  Patient presents with   Facial Swelling    Edward [name] is a 76 y.o. male.  HPI   76 year old male presents emergency department with complaints of bilateral facial swelling around the angle of mandible.  Patient states that symptoms began yesterday and resolved with massaging of his face.  He reports some mild residual right-sided swelling that is not noticeable.  Denies any dental pain, fever, dry mouth, sore throat, difficulty breathing/swallowing, rash, pruritic type sensation.  Patient states that he recently began pantoprazole this past Friday after a colonoscopy/endoscopy.  He is concerned about allergic reaction due to the medication and came to the emergency department for further evaluation.  He is taken no medication for this.  Denies recent changes in medication besides above, new soaps/colognes/lotion, detergents, bug bites/bee stings,  Past medical history significant for CAD, CHF, hyperlipidemia, hypertension, OSA, atrial fibrillation, arrhythmia, AICD, sick sinus syndrome  Home Medications Prior to Admission medications   Medication Sig Start Date End Date Taking? Authorizing Provider  ACETYLCARNITINE HCL PO Take 800 mg by mouth daily.    [provider]  amiodarone (PACERONE) 200 MG tablet Take 200 mg by mouth daily. 11/22/19   [provider]  aspirin EC 81 MG tablet Take 81 mg by mouth daily.    [provider]  carvedilol (COREG) 3.125 MG tablet Take 3.125 mg by mouth 2 (two) times daily. 05/07/20   [provider]  Cholecalciferol (VITAMIN D3) 1000 units CAPS Take 1,000 mg by mouth daily. 06/06/22   Cirigliano, Vito V, DO  Coenzyme Q10 (CO Q 10) 100 MG CAPS Take 300 mg by mouth daily. 100 mg and 200 mg tablets    [provider]  COLLAGEN PO Take 3 tablets by mouth daily.     [provider]  dapagliflozin propanediol (FARXIGA) 5 MG TABS tablet Take 5 mg by mouth daily. 05/08/20   [provider]  ENTRESTO 49-51 MG TAKE 1 TABLET BY MOUTH TWICE DAILY 06/18/21   [name], Nadean Corwin, MD  Ferrous Sulfate (IRON PO) Take 25 mg by mouth daily.    [provider]  fish oil-omega-3 fatty acids 1000 MG capsule Take 2 g by mouth daily.    [provider]  fluticasone (FLONASE) 50 MCG/ACT nasal spray Place 2 sprays into both nostrils daily as needed for allergies.    [provider]  Magnesium 400 MG CAPS Take 400 mg by mouth daily.    [provider]  Multiple Vitamin (MULTIVITAMIN) tablet Take 1 tablet by mouth daily.    [provider]  pantoprazole (PROTONIX) 40 MG tablet Take 1 tablet (40 mg total) by mouth 2 (two) times daily. Take 1 tablet p.o. twice daily x4 weeks, then reduce to 1 tablet daily x4 weeks, then can discontinue 06/06/22 06/06/23  Cirigliano, Vito V, DO  Polyethyl Glycol-Propyl Glycol (SYSTANE) 0.4-0.3 % SOLN Place 1 drop into both eyes daily as needed (dry eyes).    [provider]  potassium chloride SA (KLOR-CON) 20 MEQ tablet Take 20 mEq by mouth daily. 10/11/19   [provider]  rosuvastatin (CRESTOR) 20 MG tablet Take 1 tablet (20 mg total) by mouth daily. KEEP UPCOMING APPOINTMENT FOR FUTURE REFILLS. Patient taking differently: Take 20 mg by mouth every other day. KEEP UPCOMING APPOINTMENT FOR FUTURE REFILLS. 03/26/22   Lyman Bishop  C, MD  tadalafil (CIALIS) 5 MG tablet Take 5 mg by mouth daily in the afternoon.    [provider]      Allergies    Metoprolol, Beta adrenergic blockers, and Betapace [sotalol hcl]    Review of Systems   Review of Systems  All other systems reviewed and are negative.   Physical Exam Updated Vital Signs Ht '6\' 3"'$  (1.905 m)   Wt 99.8 kg   BMI 27.50 kg/m  Physical Exam Vitals and nursing note reviewed.  Constitutional:       General: He is not in acute distress.    Appearance: He is well-developed. He is not ill-appearing.  HENT:     Head: Normocephalic and atraumatic.     Comments: No obvious external signs of swelling, erythema, areas of fluctuance.  No tenderness to palpation of said areas of prior swelling.    Mouth/Throat:     Comments: Uvula midline rises symmetrically with phonation.  Tonsils 01+ bilaterally with no obvious erythema or exudate.  Dentition in good repair with no signs of dental pain, fractured teeth.  No tenderness to palpation along gumline's. Eyes:     Conjunctiva/sclera: Conjunctivae normal.  Cardiovascular:     Rate and Rhythm: Normal rate and regular rhythm.     Heart sounds: No murmur heard. Pulmonary:     Effort: Pulmonary effort is normal. No respiratory distress.     Breath sounds: Normal breath sounds.  Abdominal:     Palpations: Abdomen is soft.     Tenderness: There is no abdominal tenderness.  Musculoskeletal:        General: No swelling.     Cervical back: Normal range of motion and neck supple. No rigidity or tenderness.  Skin:    General: Skin is warm and dry.     Capillary Refill: Capillary refill takes less than 2 seconds.     Comments: No obvious rash.  Neurological:     Mental Status: He is alert.  Psychiatric:        Mood and Affect: Mood normal.     ED Results / Procedures / Treatments   Labs (all labs ordered are listed, but only abnormal results are displayed) Labs Reviewed - No data to display  EKG None  Radiology No results found.  Procedures Procedures    Medications Ordered in ED Medications  dexamethasone (DECADRON) tablet 10 mg (10 mg Oral Given 06/14/22 1538)    ED Course/ Medical Decision Making/ A&P                           Medical Decision Making Risk Prescription drug management.   This patient presents to the ED for concern of facial swelling, this involves an extensive number of treatment options, and is a complaint  that carries with it a high risk of complications and morbidity.  The differential diagnosis includes Ludwig angina, parotitis, anaphylaxis, dental abscess, peritonsillar abscess, dental fracture, cellulitis, erysipelas, Lemierre's disease   Co morbidities that complicate the patient evaluation  See HPI   Additional history obtained:  Additional history obtained from EMR External records from outside source obtained and reviewed including hospital records   Lab Tests:  N/a   Imaging Studies ordered:  N/a   Cardiac Monitoring: / EKG:  The patient was maintained on a cardiac monitor.  I personally viewed and interpreted the cardiac monitored which showed an underlying rhythm of: Sinus rhythm   Consultations Obtained:  N/a  Problem List / ED Course / Critical interventions / Medication management  Facial swelling I ordered medication including Decadron   Reevaluation of the patient after these medicines showed that the patient improved I have reviewed the patients home medicines and have made adjustments as needed   Social Determinants of Health:  Former cigarette use.  Denies illicit drug use.   Test / Admission - Considered:  Facial swelling Vitals signs within normal range and stable throughout visit. Unsure of exact etiology of patient's symptoms.  Given new medication and subsequent facial swelling, will treat for allergic type reaction.  Patient exhibiting no signs of anaphylaxis and symptoms have resolved.  Given 1 dose of Decadron while emergency department to decrease likelihood of biphasic reaction.  No obvious dental pain, no obvious signs of glandular enlargement.  Further work-up deemed unnecessary at this time.  Patient recommended to take Benadryl as well as famotidine outpatient if symptoms recur.  Close follow-up with PCP recommended 3 to 5 days for reevaluation.  Patient educated to DC pantoprazole at this moment for medical alternative as prescribed  by GI.  Treatment plan discussed with patient he acknowledged understand was agreeable to said plan. Worrisome signs and symptoms were discussed with the patient, and the patient acknowledged understanding to return to the ED if noticed. Patient was stable upon discharge.          Final Clinical Impression(s) / ED Diagnoses Final diagnoses:  Facial swelling    Rx / DC Orders ED Discharge Orders     None         Wilnette Kales, [place] 06/14/22 1543    Kemper Durie, DO 06/14/22 1627

## 2022-06-14 NOTE — Discharge Instructions (Signed)
Note your work-up today was overall reassuring.  You can take Benadryl and Pepcid at home if symptoms recur.  Recommend reevaluation by your primary care provider.  As discussed, please do not hesitate to return to emergency department for worrisome signs and symptoms we discussed become apparent.

## 2022-12-31 ENCOUNTER — Other Ambulatory Visit: Payer: Self-pay | Admitting: Urology

## 2022-12-31 DIAGNOSIS — R3915 Urgency of urination: Secondary | ICD-10-CM

## 2022-12-31 DIAGNOSIS — N401 Enlarged prostate with lower urinary tract symptoms: Secondary | ICD-10-CM

## 2023-01-01 ENCOUNTER — Other Ambulatory Visit: Payer: Self-pay | Admitting: Interventional Radiology

## 2023-01-01 ENCOUNTER — Ambulatory Visit
Admission: RE | Admit: 2023-01-01 | Discharge: 2023-01-01 | Disposition: A | Discharge status: Home / Self Care | Payer: Medicare Other | Source: Ambulatory Visit | Attending: Urology | Admitting: Urology

## 2023-01-01 DIAGNOSIS — R3915 Urgency of urination: Secondary | ICD-10-CM

## 2023-01-01 DIAGNOSIS — N401 Enlarged prostate with lower urinary tract symptoms: Secondary | ICD-10-CM

## 2023-01-01 NOTE — Procedures (Addendum)
Chief Complaint: Patient was seen in consultation today for No chief complaint on file.  at the request of Wrenn,John  Referring Physician(s): Wrenn,John  History of Present Illness: Edward [name] is a 77 y.o. male retired [employer] whose been having worsening outflow symptoms from BPH over the past few years.  Current symptoms managed with tamsulosin, tadalafil, and Gemtesa with  partial symptomatic relief although still  lifestyle limited because of frequency and urgency symptoms e.g. he is averse to long drives out of town. On recent ultrasound, Estimated prostate volume 204 cc, Postvoid residual 103 mL.Today's IPSS score 17. He does have a history of coronary disease status post stenting x 3, ablation for atrial fibrillation, EF estimated 40-50%.  He does have a history of transient renal insufficiency when on heavy diuretics, which is resolved.  No history of PAD.  He is able to ambulate about 4 miles a day.  No claudication symptoms.  He is wary of   complications of alternative surgical  treatments including TURP and is motivated to proceed with PAE if a candidate.  He has done internet-based research on the procedures.  Past Medical History:  Diagnosis Date   AICD (automatic cardioverter/defibrillator) present    Arrhythmia    Atrial fibrillation (HCC)    PAF; ablation in 2009   Back pain    BPH (benign prostatic hyperplasia)    CAD (coronary artery disease)    Congestive heart failure (CHF) (HCC)    History of nuclear stress test 11/11/2011   lexiscan; mild ischemia in apical inferior & apical lateral regions   Hyperlipidemia    Hypertension    Knee pain    bilat arthritis   OSA (obstructive sleep apnea)    CPAP; sleep study 06/2007 - AHI during total sleep 25.95/hr and during REM 32.57/hr (moderate sleep apnea); postural component w/severe sleep apnea during supine (AHI 76.36/hr)   Presence of permanent cardiac pacemaker    Ruptured lumbar  disc    SSS (sick sinus syndrome) (HCC)     Past Surgical History:  Procedure Laterality Date   ATRIAL FIBRILLATION ABLATION  08/13/2007   [facility] - isthmius tricuspid abaltion w/no evidence of ABNRT   BACK SURGERY  08/13/1979   BIOPSY  06/06/2022   Procedure: BIOPSY;  Surgeon: Shellia Cleverly, DO;  Location: [facility];  Service: Gastroenterology;;   CARDIAC DEFIBRILLATOR PLACEMENT     COLONOSCOPY     COLONOSCOPY WITH PROPOFOL N/A 06/06/2022   Procedure: COLONOSCOPY WITH PROPOFOL;  Surgeon: Shellia Cleverly, DO;  Location: [facility];  Service: Gastroenterology;  Laterality: N/A;   ESOPHAGOGASTRODUODENOSCOPY (EGD) WITH PROPOFOL N/A 06/06/2022   Procedure: ESOPHAGOGASTRODUODENOSCOPY (EGD) WITH PROPOFOL;  Surgeon: Shellia Cleverly, DO;  Location: [facility];  Service: Gastroenterology;  Laterality: N/A;  colon cancer screening, normocytic anemia   KNEE SURGERY Bilateral 1961, 1989, 1991   3 surg on right/ 1 surg on left   NOSE SURGERY     couple surg   PACEMAKER IMPLANT     POLYPECTOMY  06/06/2022   Procedure: POLYPECTOMY;  Surgeon: Shellia Cleverly, DO;  Location: [facility];  Service: Gastroenterology;;   TRANSTHORACIC ECHOCARDIOGRAM  04/12/2012   EF=>55%; mod conc LVH; LA mod dilated; mild MR; trace TR; AV mildly sclerotic & mild regurg; aortic root sclerosis/calcif    Allergies: Metoprolol, Beta adrenergic blockers, and Betapace [sotalol hcl]  Medications: Prior to Admission medications   Medication Sig Start Date End Date Taking? Authorizing Provider  ACETYLCARNITINE HCL  PO Take 800 mg by mouth daily.    [provider]  amiodarone (PACERONE) 200 MG tablet Take 200 mg by mouth daily. 11/22/19   [provider]  aspirin EC 81 MG tablet Take 81 mg by mouth daily.    [provider]  carvedilol (COREG) 3.125 MG tablet Take 3.125 mg by mouth 2 (two) times daily. 05/07/20   [provider]  Cholecalciferol (VITAMIN D3) 1000 units  CAPS Take 1,000 mg by mouth daily. 06/06/22   Cirigliano, Vito V, DO  Coenzyme Q10 (CO Q 10) 100 MG CAPS Take 300 mg by mouth daily. 100 mg and 200 mg tablets    [provider]  COLLAGEN PO Take 3 tablets by mouth daily.    [provider]  dapagliflozin propanediol (FARXIGA) 5 MG TABS tablet Take 5 mg by mouth daily. 05/08/20   [provider]  ENTRESTO 49-51 MG TAKE 1 TABLET BY MOUTH TWICE DAILY 06/18/21   [name], Lisette Abu, MD  Ferrous Sulfate (IRON PO) Take 25 mg by mouth daily.    [provider]  fish oil-omega-3 fatty acids 1000 MG capsule Take 2 g by mouth daily.    [provider]  fluticasone (FLONASE) 50 MCG/ACT nasal spray Place 2 sprays into both nostrils daily as needed for allergies.    [provider]  Magnesium 400 MG CAPS Take 400 mg by mouth daily.    [provider]  Multiple Vitamin (MULTIVITAMIN) tablet Take 1 tablet by mouth daily.    [provider]  pantoprazole (PROTONIX) 40 MG tablet Take 1 tablet (40 mg total) by mouth 2 (two) times daily. Take 1 tablet p.o. twice daily x4 weeks, then reduce to 1 tablet daily x4 weeks, then can discontinue 06/06/22 06/06/23  Cirigliano, Vito V, DO  Polyethyl Glycol-Propyl Glycol (SYSTANE) 0.4-0.3 % SOLN Place 1 drop into both eyes daily as needed (dry eyes).    [provider]  potassium chloride SA (KLOR-CON) 20 MEQ tablet Take 20 mEq by mouth daily. 10/11/19   [provider]  rosuvastatin (CRESTOR) 20 MG tablet Take 1 tablet (20 mg total) by mouth daily. KEEP UPCOMING APPOINTMENT FOR FUTURE REFILLS. Patient taking differently: Take 20 mg by mouth every other day. KEEP UPCOMING APPOINTMENT FOR FUTURE REFILLS. 03/26/22   [name], Lisette Abu, MD  tadalafil (CIALIS) 5 MG tablet Take 5 mg by mouth daily in the afternoon.    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: [employer]  Tobacco Use   Smoking status: Former    Types: Pipe    Quit date: 02/21/1968    Years since quitting: 54.8   Smokeless tobacco: Never  Vaping Use   Vaping Use: Never used  Substance and Sexual Activity   Alcohol use: Yes    Comment: rare   Drug use: No   Sexual activity: Not on file  Other Topics Concern   Not on file  Social History Narrative   Not on file   Social Determinants of Health   Financial Resource Strain: Not on file  Food Insecurity: Not on file  Transportation Needs: Not on file  Physical Activity: Not on file  Stress: Not on file  Social Connections: Not on file    ECOG Status: 1 - Symptomatic but completely ambulatory  Review of Systems: A 12 point ROS discussed and pertinent positives are indicated in the HPI above.  All other systems are negative.  Review of Systems  Vital Signs: BP (!) 154/92 (BP Location: Left Arm, Patient Position: Sitting, Cuff Size: Normal)   Pulse 66   Temp 98.1 F (36.7 C) (Oral)   Wt 99.8 kg   SpO2 98% Comment: room air  BMI 27.50 kg/m      Physical Exam Constitutional: Oriented to person, place, and time. Well-developed and well-nourished. No distress.  Last Weight  Most recent update: 01/01/2023  9:53 AM    Weight  99.8 kg (220 lb)            HENT:  Head: Normocephalic and atraumatic.  Eyes: Conjunctivae and EOM are normal. Right eye exhibits no discharge. Left eye exhibits no discharge. No scleral icterus. Right eyelid swelling and redness attributed to obstructed duct. Neck: No JVD present.  Pulmonary/Chest: Effort normal. No stridor. No respiratory distress.  Abdomen: soft, non distended Neurological:  alert and oriented to person, place, and time.   Skin: Skin is warm and dry.  not diaphoretic.  Psychiatric:   normal mood and affect.   behavior is normal. Judgment and thought content normal.      Imaging: No results found.  Labs:  Comprehensive Metabolic Panel Order: [id number] Component Ref Range & Units 5 mo ago  Sodium 135 - 146 MMOL/L 141  Potassium 3.5 - 5.3 MMOL/L 5.1  Chloride 98 - 110 MMOL/L 105  CO2 23 - 30 MMOL/L 29  BUN 8 - 24 MG/DL 23  Glucose 70 - 99 MG/DL 409 High   Comment: Patients taking eltrombopag at doses >/= 100 mg daily may show falsely elevated values of 10% or greater.  Creatinine 0.50 - 1.50 MG/DL 8.11  Calcium 8.5 - 91.4 MG/DL 9.5  Total Protein 6.0 - 8.3 G/DL 7.4  Comment: Patients taking eltrombopag at doses >/= 100 mg daily may show falsely elevated values of 10% or greater.  Albumin 3.5 - 5.0 G/DL 4.5  Total Bilirubin 0.1 - 1.2 MG/DL 0.7  Comment: Patients taking eltrombopag at doses >/= 100 mg daily may show falsely elevated values of 10% or greater.  Alkaline Phosphatase 25 - 125 IU/L or U/L 49  AST (SGOT) 5 - 40 IU/L or U/L 19  ALT (SGPT) 5 - 50 IU/L or U/L 13  Anion Gap 4 - 14 MMOL/L 7  Est. GFR >=60 ML/MIN/1.73 M*2 65  Comment: GFR estimated by CKD-EPI equations(NKF 2021).  "Recommend confirmation of Cr-based eGFR by using Cys-based eGFR and other filtration markers (if applicable) in complex cases and clinical decision-making, as needed."    Assessment and Plan:  76 year old male with a history of benign prostatic hyperplasia (204 g) with obstruction and lower urinary tract symptoms, lifestyle limiting.  We discussed BPH, natural history, surgical and nonsurgical treatment options, details of PAE including risks and complications. He seemed to understand and did ask appropriate questions which were answered. He is an excellent candidate for PAE,  He is agreeable and wishes to proceed with PAE.   Given history of arterial disease, will get CTA pelvic to define anatomy,  then schedule PAE with moderate sedation as outpatient.   Thank you for this interesting  consult.  I greatly enjoyed meeting RAAHIL BUB and look forward to participating in their care.  A copy of this report was sent to the requesting provider on this date.  Electronically Signed: Durwin Glaze 01/01/2023, 10:32 AM   I spent a total of  30 Minutes   in face to face in clinical consultation, greater than 50% of which was counseling/coordinating care for  benign prostatic hypertrophy.

## 2023-01-07 ENCOUNTER — Ambulatory Visit
Admission: RE | Admit: 2023-01-07 | Discharge: 2023-01-07 | Disposition: A | Discharge status: Home / Self Care | Payer: Medicare Other | Source: Ambulatory Visit | Attending: Interventional Radiology | Admitting: Interventional Radiology

## 2023-01-07 ENCOUNTER — Other Ambulatory Visit (HOSPITAL_COMMUNITY): Payer: Self-pay | Admitting: Interventional Radiology

## 2023-01-07 DIAGNOSIS — N138 Other obstructive and reflux uropathy: Secondary | ICD-10-CM

## 2023-01-07 DIAGNOSIS — R3915 Urgency of urination: Secondary | ICD-10-CM

## 2023-01-07 DIAGNOSIS — N401 Enlarged prostate with lower urinary tract symptoms: Secondary | ICD-10-CM

## 2023-01-07 MED ORDER — IOPAMIDOL (ISOVUE-370) INJECTION 76%
75.0000 mL | Freq: Once | INTRAVENOUS | Status: AC | PRN
  Administered 2023-01-07: 60 mL via INTRAVENOUS

## 2023-01-30 ENCOUNTER — Other Ambulatory Visit: Payer: Self-pay | Admitting: Student

## 2023-01-30 ENCOUNTER — Other Ambulatory Visit: Payer: Self-pay | Admitting: Internal Medicine

## 2023-01-30 DIAGNOSIS — Z419 Encounter for procedure for purposes other than remedying health state, unspecified: Secondary | ICD-10-CM

## 2023-01-30 MED ORDER — LIDOCAINE-PRILOCAINE 2.5-2.5 % EX CREA: TOPICAL_CREAM | Freq: Once | CUTANEOUS | Status: DC

## 2023-01-30 MED ORDER — PREDNISONE 1 MG PO TABS: 20.0000 mg | ORAL_TABLET | Freq: Every day | ORAL | Status: DC

## 2023-01-30 MED ORDER — CIPROFLOXACIN IN D5W 400 MG/200ML IV SOLN: 400.0000 mg | INTRAVENOUS | Status: DC

## 2023-01-30 MED ORDER — PREDNISONE 1 MG PO TABS: 20.0000 mg | ORAL_TABLET | Freq: Once | ORAL | Status: DC
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
## 2023-01-31 ENCOUNTER — Other Ambulatory Visit (HOSPITAL_COMMUNITY): Payer: Self-pay | Admitting: Interventional Radiology

## 2023-01-31 ENCOUNTER — Encounter (HOSPITAL_COMMUNITY): Payer: Self-pay

## 2023-01-31 ENCOUNTER — Ambulatory Visit (HOSPITAL_COMMUNITY)
Admission: RE | Admit: 2023-01-31 | Discharge: 2023-01-31 | Disposition: A | Discharge status: Home / Self Care | Payer: Medicare Other | Source: Ambulatory Visit | Attending: Interventional Radiology | Admitting: Interventional Radiology

## 2023-01-31 ENCOUNTER — Other Ambulatory Visit: Payer: Self-pay

## 2023-01-31 DIAGNOSIS — N138 Other obstructive and reflux uropathy: Secondary | ICD-10-CM

## 2023-01-31 DIAGNOSIS — G4733 Obstructive sleep apnea (adult) (pediatric): Secondary | ICD-10-CM | POA: Diagnosis not present

## 2023-01-31 DIAGNOSIS — Z95810 Presence of automatic (implantable) cardiac defibrillator: Secondary | ICD-10-CM | POA: Diagnosis not present

## 2023-01-31 DIAGNOSIS — E785 Hyperlipidemia, unspecified: Secondary | ICD-10-CM | POA: Diagnosis not present

## 2023-01-31 DIAGNOSIS — I110 Hypertensive heart disease with heart failure: Secondary | ICD-10-CM | POA: Diagnosis not present

## 2023-01-31 DIAGNOSIS — Z87891 Personal history of nicotine dependence: Secondary | ICD-10-CM | POA: Diagnosis not present

## 2023-01-31 DIAGNOSIS — Z419 Encounter for procedure for purposes other than remedying health state, unspecified: Secondary | ICD-10-CM

## 2023-01-31 DIAGNOSIS — I2510 Atherosclerotic heart disease of native coronary artery without angina pectoris: Secondary | ICD-10-CM | POA: Diagnosis not present

## 2023-01-31 DIAGNOSIS — I4891 Unspecified atrial fibrillation: Secondary | ICD-10-CM | POA: Diagnosis not present

## 2023-01-31 DIAGNOSIS — N401 Enlarged prostate with lower urinary tract symptoms: Secondary | ICD-10-CM | POA: Diagnosis not present

## 2023-01-31 DIAGNOSIS — I495 Sick sinus syndrome: Secondary | ICD-10-CM | POA: Insufficient documentation

## 2023-01-31 DIAGNOSIS — I509 Heart failure, unspecified: Secondary | ICD-10-CM | POA: Insufficient documentation

## 2023-01-31 HISTORY — PX: IR 3D INDEPENDENT WKST: IMG2385

## 2023-01-31 HISTORY — PX: IR ANGIOGRAM PELVIS SELECTIVE OR SUPRASELECTIVE: IMG661

## 2023-01-31 HISTORY — PX: IR ANGIOGRAM SELECTIVE EACH ADDITIONAL VESSEL: IMG667

## 2023-01-31 HISTORY — PX: IR US GUIDE VASC ACCESS LEFT: IMG2389

## 2023-01-31 HISTORY — PX: IR EMBO ARTERIAL NOT HEMORR HEMANG INC GUIDE ROADMAPPING: IMG5448

## 2023-01-31 LAB — BASIC METABOLIC PANEL
Anion gap: 9 (ref 5–15)
BUN: 23 mg/dL (ref 8–23)
CO2: 26 mmol/L (ref 22–32)
Calcium: 8.7 mg/dL — ABNORMAL LOW (ref 8.9–10.3)
Chloride: 103 mmol/L (ref 98–111)
Creatinine, Ser: 1.17 mg/dL (ref 0.61–1.24)
GFR, Estimated: >60 mL/min (ref >60)
Glucose, Bld: 130 mg/dL — ABNORMAL HIGH (ref 70–99)
Potassium: 3.3 mmol/L — ABNORMAL LOW (ref 3.5–5.1)
Sodium: 138 mmol/L (ref 135–145)

## 2023-01-31 LAB — CBC
HCT: 36.6 % — ABNORMAL LOW (ref 39.0–52.0)
Hemoglobin: 12.2 g/dL — ABNORMAL LOW (ref 13.0–17.0)
MCH: 32.5 pg (ref 26.0–34.0)
MCHC: 33.3 g/dL (ref 30.0–36.0)
MCV: 97.6 fL (ref 80.0–100.0)
Platelets: 150 10*3/uL (ref 150–400)
RBC: 3.75 MIL/uL — ABNORMAL LOW (ref 4.22–5.81)
RDW: 13.6 % (ref 11.5–15.5)
WBC: 4.3 10*3/uL (ref 4.0–10.5)
nRBC: 0 % (ref 0.0–0.2)

## 2023-01-31 LAB — PROTIME-INR
INR: 1.1 (ref 0.8–1.2)
Prothrombin Time: 14.2 seconds (ref 11.4–15.2)

## 2023-01-31 MED ORDER — FENTANYL CITRATE (PF) 100 MCG/2ML IJ SOLN
INTRAMUSCULAR | Status: AC | PRN
  Administered 2023-01-31 (×4): 50 ug via INTRAVENOUS

## 2023-01-31 MED ORDER — VERAPAMIL HCL 2.5 MG/ML IV SOLN
INTRAVENOUS | Status: AC
  Filled 2023-01-31: qty 2

## 2023-01-31 MED ORDER — FENTANYL CITRATE (PF) 100 MCG/2ML IJ SOLN
INTRAMUSCULAR | Status: AC
  Filled 2023-01-31: qty 4

## 2023-01-31 MED ORDER — NITROGLYCERIN IN D5W 100-5 MCG/ML-% IV SOLN
INTRAVENOUS | Status: AC
  Filled 2023-01-31: qty 250

## 2023-01-31 MED ORDER — NITROGLYCERIN 2 % TD OINT
TOPICAL_OINTMENT | TRANSDERMAL | Status: AC
  Filled 2023-01-31: qty 1

## 2023-01-31 MED ORDER — PREDNISONE 20 MG PO TABS
20.0000 mg | ORAL_TABLET | Freq: Once | ORAL | Status: AC
  Administered 2023-01-31: 20 mg via ORAL
  Filled 2023-01-31: qty 1

## 2023-01-31 MED ORDER — SODIUM CHLORIDE 0.9 % IV SOLN: INTRAVENOUS | Status: DC

## 2023-01-31 MED ORDER — VERAPAMIL HCL 2.5 MG/ML IV SOLN
INTRA_ARTERIAL | Status: AC | PRN
  Administered 2023-01-31: 6 mL via INTRA_ARTERIAL

## 2023-01-31 MED ORDER — HYDROCODONE-ACETAMINOPHEN 5-325 MG PO TABS: 1.0000 | ORAL_TABLET | ORAL | Status: DC | PRN

## 2023-01-31 MED ORDER — HEPARIN SODIUM (PORCINE) 1000 UNIT/ML IJ SOLN
INTRAMUSCULAR | Status: AC
  Filled 2023-01-31: qty 10

## 2023-01-31 MED ORDER — FLUMAZENIL 0.5 MG/5ML IV SOLN
INTRAVENOUS | Status: AC
  Filled 2023-01-31: qty 5

## 2023-01-31 MED ORDER — SOLIFENACIN SUCCINATE 5 MG PO TABS: 5.0000 mg | ORAL_TABLET | Freq: Every day | ORAL | 0 refills | Status: AC

## 2023-01-31 MED ORDER — MIDAZOLAM HCL 2 MG/2ML IJ SOLN
INTRAMUSCULAR | Status: AC | PRN
  Administered 2023-01-31 (×4): 1 mg via INTRAVENOUS

## 2023-01-31 MED ORDER — CIPROFLOXACIN HCL 500 MG PO TABS: 500.0000 mg | ORAL_TABLET | Freq: Two times a day (BID) | ORAL | 0 refills | Status: AC

## 2023-01-31 MED ORDER — DIPHENHYDRAMINE HCL 50 MG/ML IJ SOLN
INTRAMUSCULAR | Status: AC
  Filled 2023-01-31: qty 1

## 2023-01-31 MED ORDER — NALOXONE HCL 0.4 MG/ML IJ SOLN
INTRAMUSCULAR | Status: AC
  Filled 2023-01-31: qty 1

## 2023-01-31 MED ORDER — PHENAZOPYRIDINE HCL 100 MG PO TABS: 100.0000 mg | ORAL_TABLET | Freq: Three times a day (TID) | ORAL | 0 refills | Status: AC

## 2023-01-31 MED ORDER — MIDAZOLAM HCL 2 MG/2ML IJ SOLN
INTRAMUSCULAR | Status: AC
  Filled 2023-01-31: qty 4

## 2023-01-31 MED ORDER — LIDOCAINE-PRILOCAINE 2.5-2.5 % EX CREA
TOPICAL_CREAM | Freq: Once | CUTANEOUS | Status: AC
  Filled 2023-01-31: qty 5

## 2023-01-31 MED ORDER — VERAPAMIL HCL 2.5 MG/ML IV SOLN: INTRA_ARTERIAL | Status: AC | PRN

## 2023-01-31 MED ORDER — IOHEXOL 300 MG/ML  SOLN
50.0000 mL | Freq: Once | INTRAMUSCULAR | Status: AC | PRN
  Administered 2023-01-31: 5 mL via INTRA_ARTERIAL

## 2023-01-31 MED ORDER — IOHEXOL 300 MG/ML  SOLN
50.0000 mL | Freq: Once | INTRAMUSCULAR | Status: AC | PRN
  Administered 2023-01-31: 12 mL via INTRA_ARTERIAL

## 2023-01-31 MED ORDER — IOHEXOL 300 MG/ML  SOLN
100.0000 mL | Freq: Once | INTRAMUSCULAR | Status: AC | PRN
  Administered 2023-01-31: 20 mL via INTRA_ARTERIAL

## 2023-01-31 MED ORDER — SULFAMETHOXAZOLE-TRIMETHOPRIM 800-160 MG PO TABS: 1.0000 | ORAL_TABLET | Freq: Two times a day (BID) | ORAL | 0 refills | Status: AC

## 2023-01-31 MED ORDER — CIPROFLOXACIN IN D5W 400 MG/200ML IV SOLN
400.0000 mg | INTRAVENOUS | Status: AC
  Administered 2023-01-31: 400 mg via INTRAVENOUS
  Filled 2023-01-31: qty 200

## 2023-01-31 MED ORDER — LIDOCAINE HCL (PF) 1 % IJ SOLN
INTRAMUSCULAR | Status: AC
  Filled 2023-01-31: qty 30

## 2023-01-31 MED ORDER — NITROGLYCERIN 1 MG/10 ML FOR IR/CATH LAB
INTRA_ARTERIAL | Status: AC | PRN
  Administered 2023-01-31 (×2): 100 ug

## 2023-01-31 NOTE — Procedures (Signed)
  Procedure:  Bilateral prostate artery embolization  via L radial Preprocedure diagnosis: Diagnoses of BPH with obstruction/lower urinary tract symptoms and Elective surgery were pertinent to this visit. Postprocedure diagnosis: same EBL:    minimal Complications:   none immediate  See full dictation in [employer].  Thora Lance MD Main # [phone] Pager  [phone] Mobile [phone]

## 2023-02-25 ENCOUNTER — Other Ambulatory Visit: Payer: Self-pay | Admitting: Interventional Radiology

## 2023-02-25 DIAGNOSIS — N401 Enlarged prostate with lower urinary tract symptoms: Secondary | ICD-10-CM

## 2023-02-25 DIAGNOSIS — R3915 Urgency of urination: Secondary | ICD-10-CM

## 2023-03-19 ENCOUNTER — Ambulatory Visit
Admission: RE | Admit: 2023-03-19 | Discharge: 2023-03-19 | Disposition: A | Discharge status: Home / Self Care | Payer: Medicare Other | Source: Ambulatory Visit | Attending: Interventional Radiology | Admitting: Interventional Radiology

## 2023-03-19 DIAGNOSIS — R3915 Urgency of urination: Secondary | ICD-10-CM

## 2023-03-19 DIAGNOSIS — N401 Enlarged prostate with lower urinary tract symptoms: Secondary | ICD-10-CM

## 2023-03-19 NOTE — Progress Notes (Addendum)
Patient ID: Edward [name], male   DOB: 25-Jan-1946, 77 y.o.   MRN: [id number]       Chief Complaint: Patient was consulted remotely today (TeleHealth) for   BPH, post bilateral prostate artery embolization 01/31/2023  at the request of ,.    Referring Physician(s): Bjorn Pippin  History of Present Illness: Edward [name] is a 77 y.o. male retired [employer] Who had been having worsening outflow symptoms from BPH over the past few years  managed with tamsulosin, tadalafil, and Gemtesa with partial symptomatic relief although still lifestyle limited because of frequency and urgency symptoms e.g.  averse to long drives out of town. On Preop ultrasound, Estimated prostate volume 204 cc, Postvoid residual 103 mL. He was able to ambulate about 4 miles a day. No claudication symptoms.   He underwent uncomplicated transradial bilateral prostate artery embolization 01/31/2023.  He did well and was discharged home the same day.  He states soreness postprocedure relieved after 24 hours.  No new symptoms.  He has had significant improvement in his urinary urgency.  He can Tolerate off Flomax for up to 4 days but then has Attenuation of stream.  No hematuria or other complications.  He is currently under the weather with a cold or the COVID.He has not seen Dr. Annabell Howells since the procedure.  Past Medical History:  Diagnosis Date   AICD (automatic cardioverter/defibrillator) present    Arrhythmia    Atrial fibrillation (HCC)    PAF; ablation in 2009   Back pain    BPH (benign prostatic hyperplasia)    CAD (coronary artery disease)    Congestive heart failure (CHF) (HCC)    History of nuclear stress test 11/11/2011   lexiscan; mild ischemia in apical inferior & apical lateral regions   Hyperlipidemia    Hypertension    Knee pain    bilat arthritis   OSA (obstructive sleep apnea)    CPAP; sleep study 06/2007 - AHI during total sleep 25.95/hr and during REM 32.57/hr  (moderate sleep apnea); postural component w/severe sleep apnea during supine (AHI 76.36/hr)   Presence of permanent cardiac pacemaker    Ruptured lumbar disc    SSS (sick sinus syndrome) (HCC)     Past Surgical History:  Procedure Laterality Date   ATRIAL FIBRILLATION ABLATION  08/13/2007   [facility] - isthmius tricuspid abaltion w/no evidence of ABNRT   BACK SURGERY  08/13/1979   BIOPSY  06/06/2022   Procedure: BIOPSY;  Surgeon: Shellia Cleverly, DO;  Location: [facility];  Service: Gastroenterology;;   CARDIAC DEFIBRILLATOR PLACEMENT     COLONOSCOPY     COLONOSCOPY WITH PROPOFOL N/A 06/06/2022   Procedure: COLONOSCOPY WITH PROPOFOL;  Surgeon: Shellia Cleverly, DO;  Location: [facility];  Service: Gastroenterology;  Laterality: N/A;   ESOPHAGOGASTRODUODENOSCOPY (EGD) WITH PROPOFOL N/A 06/06/2022   Procedure: ESOPHAGOGASTRODUODENOSCOPY (EGD) WITH PROPOFOL;  Surgeon: Shellia Cleverly, DO;  Location: [facility];  Service: Gastroenterology;  Laterality: N/A;  colon cancer screening, normocytic anemia   IR 3D INDEPENDENT WKST  01/31/2023   IR ANGIOGRAM PELVIS SELECTIVE OR SUPRASELECTIVE  01/31/2023   IR ANGIOGRAM SELECTIVE EACH ADDITIONAL VESSEL  01/31/2023   IR ANGIOGRAM SELECTIVE EACH ADDITIONAL VESSEL  01/31/2023   IR EMBO ARTERIAL NOT HEMORR HEMANG INC GUIDE ROADMAPPING  01/31/2023   IR US GUIDE VASC ACCESS LEFT  01/31/2023   KNEE SURGERY Bilateral 1961, 1989, 1991   3 surg on right/ 1 surg on left   NOSE SURGERY  couple surg   PACEMAKER IMPLANT     POLYPECTOMY  06/06/2022   Procedure: POLYPECTOMY;  Surgeon: Shellia Cleverly, DO;  Location: [facility];  Service: Gastroenterology;;   TRANSTHORACIC ECHOCARDIOGRAM  04/12/2012   EF=>55%; mod conc LVH; LA mod dilated; mild MR; trace TR; AV mildly sclerotic & mild regurg; aortic root sclerosis/calcif    Allergies: Metoprolol, Beta adrenergic blockers, and Betapace [sotalol hcl]  Medications: Prior to Admission medications    Medication Sig Start Date End Date Taking? Authorizing Provider  ACETYLCARNITINE HCL PO Take 800 mg by mouth daily.    [provider]  amiodarone (PACERONE) 200 MG tablet Take 200 mg by mouth daily. 11/22/19   [provider]  aspirin EC 81 MG tablet Take 81 mg by mouth daily.    [provider]  carvedilol (COREG) 3.125 MG tablet Take 3.125 mg by mouth 2 (two) times daily. 05/07/20   [provider]  Cholecalciferol (VITAMIN D3) 1000 units CAPS Take 1,000 mg by mouth daily. 06/06/22   Cirigliano, Vito V, DO  Coenzyme Q10 (CO Q 10) 100 MG CAPS Take 300 mg by mouth daily. 100 mg and 200 mg tablets    [provider]  COLLAGEN PO Take 3 tablets by mouth daily.    [provider]  dapagliflozin propanediol (FARXIGA) 5 MG TABS tablet Take 5 mg by mouth daily. 05/08/20   [provider]  ENTRESTO 49-51 MG TAKE 1 TABLET BY MOUTH TWICE DAILY 06/18/21   [name], Lisette Abu, MD  Ferrous Sulfate (IRON PO) Take 25 mg by mouth daily.    [provider]  fish oil-omega-3 fatty acids 1000 MG capsule Take 2 g by mouth daily.    [provider]  fluticasone (FLONASE) 50 MCG/ACT nasal spray Place 2 sprays into both nostrils daily as needed for allergies.    [provider]  Magnesium 400 MG CAPS Take 400 mg by mouth daily.    [provider]  Multiple Vitamin (MULTIVITAMIN) tablet Take 1 tablet by mouth daily.    [provider]  pantoprazole (PROTONIX) 40 MG tablet Take 1 tablet (40 mg total) by mouth 2 (two) times daily. Take 1 tablet p.o. twice daily x4 weeks, then reduce to 1 tablet daily x4 weeks, then can discontinue 06/06/22 06/06/23  Cirigliano, Vito V, DO  phenazopyridine (PYRIDIUM) 100 MG tablet Take 1 tablet (100 mg total) by mouth 3 (three) times daily with meals. 01/31/23   Kennieth Francois, PA  Polyethyl Glycol-Propyl Glycol (SYSTANE) 0.4-0.3 % SOLN Place 1 drop into both eyes daily as needed (dry  eyes).    [provider]  potassium chloride SA (KLOR-CON) 20 MEQ tablet Take 20 mEq by mouth daily. 10/11/19   [provider]  rosuvastatin (CRESTOR) 20 MG tablet Take 1 tablet (20 mg total) by mouth daily. KEEP UPCOMING APPOINTMENT FOR FUTURE REFILLS. Patient taking differently: Take 20 mg by mouth every other day. KEEP UPCOMING APPOINTMENT FOR FUTURE REFILLS. 03/26/22   [name], Lisette Abu, MD  sulfamethoxazole-trimethoprim (BACTRIM DS) 800-160 MG tablet Take 1 tablet by mouth 2 (two) times daily. 01/31/23   Oley Balm, MD  tadalafil (CIALIS) 5 MG tablet Take 5 mg by mouth daily in the afternoon.    [provider]     Family History  Adopted: Yes    Social History   Socioeconomic History   Marital status: Married    Spouse name: Not on file   Number of children: 1  Years of education: college   Highest education level: Not on file  Occupational History   Occupation: retired    Associate Professor: [employer]    Comment: Copy: [employer]  Tobacco Use   Smoking status: Former    Types: Pipe    Quit date: 02/21/1968    Years since quitting: 55.1   Smokeless tobacco: Never  Vaping Use   Vaping status: Never Used  Substance and Sexual Activity   Alcohol use: Yes    Comment: rare   Drug use: No   Sexual activity: Not on file  Other Topics Concern   Not on file  Social History Narrative   Not on file   Social Determinants of Health   Financial Resource Strain: Low Risk  (02/07/2020)   Received from [facility] visits prior to 10/12/2022., [facility] visits prior to 10/12/2022.   Overall Financial Resource Strain (CARDIA)    Difficulty of Paying Living Expenses: Not hard at all  Food Insecurity: No Food Insecurity (02/07/2020)   Received from [facility] visits prior to 10/12/2022., [facility] visits prior to 10/12/2022.   Hunger Vital  Sign    Worried About Running Out of Food in the Last Year: Never true    Ran Out of Food in the Last Year: Never true  Transportation Needs: No Transportation Needs (02/07/2020)   Received from [facility] visits prior to 10/12/2022., [facility] visits prior to 10/12/2022.   PRAPARE - Administrator, Civil Service (Medical): No    Lack of Transportation (Non-Medical): No  Physical Activity: Insufficiently Active (02/07/2020)   Received from [facility] visits prior to 10/12/2022., [facility] visits prior to 10/12/2022.   Exercise Vital Sign    Days of Exercise per Week: 3 days    Minutes of Exercise per Session: 40 min  Stress: Stress Concern Present (02/07/2020)   Received from [facility] visits prior to 10/12/2022., [facility] visits prior to 10/12/2022.   [employer] of Occupational Health - Occupational Stress Questionnaire    Feeling of Stress : To some extent  Social Connections: Unknown (12/15/2021)   Received from [facility], [facility]   Social Network    Social Network: Not on file    ECOG Status: 1 - Symptomatic but completely ambulatory  Review of Systems  Review of Systems: A 12 point ROS discussed and pertinent positives are indicated in the HPI above.  All other systems are negative.   Physical Exam No direct physical exam was performed (except for noted visual exam findings with Video Visits).     Vital Signs: There were no vitals taken for this visit.  Imaging: No results found.  Labs:  CBC: Recent Labs    01/31/23 0720  WBC 4.3  HGB 12.2*  HCT 36.6*  PLT 150    COAGS: Recent Labs    01/31/23 0720  INR 1.1    BMP: Recent Labs    01/31/23 0720  NA 138  K 3.3*  CL 103  CO2 26  GLUCOSE 130*  BUN 23  CALCIUM 8.7*  CREATININE 1.17  GFRNONAA >60    LIVER FUNCTION TESTS: No results for  input(s): "BILITOT", "AST", "ALT", "ALKPHOS", "PROT", "ALBUMIN" in the last 8760 hours.  TUMOR MARKERS: No results for  input(s): "AFPTM", "CEA", "CA199", "CHROMGRNA" in the last 8760 hours.  Assessment and Plan:  My impression is that Mr. Wallett is doing well after bilateral Prostate artery embolization, without complication.  Significant clinical improvement in his previously severe urinary urgency issues.  Still some attenuation of the stream requiring Flomax.  Hopefully continued symptomatic improvement over the next weeks and months.  He is scheduled to see Dr. Annabell Howells  in September.  I will not require a   subsequent follow-up appointment with [place], although happy to see him as needed.   Thank you for this interesting consult.  I greatly enjoyed meeting BERTIN CACCIATORE and look forward to participating in their care.  A copy of this report was sent to the requesting provider on this date.  Electronically Signed: Durwin Glaze 03/19/2023, 1:12 PM   I spent a total of    25 Minutes in remote  clinical consultation, greater than 50% of which was counseling/coordinating care for BPH post prostate artery embolization.    Visit type: Audio only (telephone). Audio (no video) only due to patient's lack of internet/smartphone capability. Alternative for in-person consultation at [facility], [address] [place], [place], [place].  This format is felt to be most appropriate for this patient at this time.  All issues noted in this document were discussed and addressed.

## 2023-04-10 NOTE — Addendum Note (Signed)
Encounter addended by: Arby Barrette on: 04/10/2023 2:37 PM  Actions taken: Imaging Exam ended

## 2023-08-12 ENCOUNTER — Other Ambulatory Visit: Payer: Self-pay

## 2023-08-12 ENCOUNTER — Emergency Department (HOSPITAL_BASED_OUTPATIENT_CLINIC_OR_DEPARTMENT_OTHER)
Admission: EM | Admit: 2023-08-12 | Discharge: 2023-08-12 | Disposition: A | Discharge status: Home / Self Care | Payer: Medicare Other | Attending: Emergency Medicine | Admitting: Emergency Medicine

## 2023-08-12 ENCOUNTER — Encounter (HOSPITAL_BASED_OUTPATIENT_CLINIC_OR_DEPARTMENT_OTHER): Payer: Self-pay

## 2023-08-12 DIAGNOSIS — X58XXXA Exposure to other specified factors, initial encounter: Secondary | ICD-10-CM | POA: Diagnosis not present

## 2023-08-12 DIAGNOSIS — Z7982 Long term (current) use of aspirin: Secondary | ICD-10-CM | POA: Diagnosis not present

## 2023-08-12 DIAGNOSIS — T162XXA Foreign body in left ear, initial encounter: Secondary | ICD-10-CM | POA: Insufficient documentation

## 2023-08-12 NOTE — ED Notes (Signed)

## 2023-08-12 NOTE — Discharge Instructions (Signed)
Return if any problems.

## 2023-08-12 NOTE — ED Triage Notes (Signed)
 Pt arrives with c/o hearing aid stuck in his left ear.

## 2023-08-12 NOTE — ED Provider Notes (Signed)
 [place] EMERGENCY DEPARTMENT AT [facility] [place] Provider Note   CSN: [id number] Arrival date & time: 08/12/23  1807     History  Chief Complaint  Patient presents with   Foreign Body in Ear    Edward [name] is a 77 y.o. male.  Patient reports he put his hearing aid in his right ear backwards.  Patient states that he cannot get it out.  Patient reports he has been trying to remove with a pair of needle-nose pliers but could not get it to come out.  Patient denies any ear pain he denies any injury  The history is provided by the patient. No language interpreter was used.  Foreign Body in Ear This is a new problem. The current episode started 12 to 24 hours ago. Nothing aggravates the symptoms. Nothing relieves the symptoms. He has tried nothing for the symptoms.       Home Medications Prior to Admission medications   Medication Sig Start Date End Date Taking? Authorizing Provider  ACETYLCARNITINE HCL PO Take 800 mg by mouth daily.    [provider]  amiodarone (PACERONE) 200 MG tablet Take 200 mg by mouth daily. 11/22/19   [provider]  aspirin EC 81 MG tablet Take 81 mg by mouth daily.    [provider]  carvedilol (COREG) 3.125 MG tablet Take 3.125 mg by mouth 2 (two) times daily. 05/07/20   [provider]  Cholecalciferol (VITAMIN D3) 1000 units CAPS Take 1,000 mg by mouth daily. 06/06/22   Cirigliano, Vito V, DO  Coenzyme Q10 (CO Q 10) 100 MG CAPS Take 300 mg by mouth daily. 100 mg and 200 mg tablets    [provider]  COLLAGEN PO Take 3 tablets by mouth daily.    [provider]  dapagliflozin propanediol (FARXIGA) 5 MG TABS tablet Take 5 mg by mouth daily. 05/08/20   [provider]  ENTRESTO  49-51 MG TAKE 1 TABLET BY MOUTH TWICE DAILY 06/18/21   [name], Vinie BROCKS, MD  Ferrous Sulfate (IRON PO) Take 25 mg by mouth daily.    [provider]  fish oil-omega-3 fatty acids 1000 MG capsule  Take 2 g by mouth daily.    [provider]  fluticasone (FLONASE) 50 MCG/ACT nasal spray Place 2 sprays into both nostrils daily as needed for allergies.    [provider]  Magnesium 400 MG CAPS Take 400 mg by mouth daily.    [provider]  Multiple Vitamin (MULTIVITAMIN) tablet Take 1 tablet by mouth daily.    [provider]  pantoprazole  (PROTONIX ) 40 MG tablet Take 1 tablet (40 mg total) by mouth 2 (two) times daily. Take 1 tablet p.o. twice daily x4 weeks, then reduce to 1 tablet daily x4 weeks, then can discontinue 06/06/22 06/06/23  Cirigliano, Vito V, DO  phenazopyridine  (PYRIDIUM ) 100 MG tablet Take 1 tablet (100 mg total) by mouth 3 (three) times daily with meals. 01/31/23   Kriste Donnice CROME, PA  Polyethyl Glycol-Propyl Glycol (SYSTANE) 0.4-0.3 % SOLN Place 1 drop into both eyes daily as needed (dry eyes).    [provider]  potassium chloride SA (KLOR-CON) 20 MEQ tablet Take 20 mEq by mouth daily. 10/11/19   [provider]  rosuvastatin  (CRESTOR ) 20 MG tablet Take 1 tablet (20 mg total) by mouth daily. KEEP UPCOMING APPOINTMENT FOR FUTURE REFILLS. Patient taking differently: Take 20 mg by mouth every other day. KEEP UPCOMING APPOINTMENT FOR FUTURE REFILLS. 03/26/22  Mona Vinie BROCKS, MD  sulfamethoxazole -trimethoprim  (BACTRIM  DS) 800-160 MG tablet Take 1 tablet by mouth 2 (two) times daily. 01/31/23   Johann Sieving, MD  tadalafil (CIALIS) 5 MG tablet Take 5 mg by mouth daily in the afternoon.    [provider]      Allergies    Metoprolol, Beta adrenergic blockers, and Betapace [sotalol hcl]    Review of Systems   Review of Systems  All other systems reviewed and are negative.   Physical Exam Updated Vital Signs BP (!) 141/78 (BP Location: Left Arm)   Pulse 63   Temp 98.6 F (37 C) (Oral)   Resp 19   Wt 99.8 kg   SpO2 99%   BMI 27.50 kg/m  Physical Exam Vitals and nursing note reviewed.  Constitutional:       Appearance: He is well-developed.  HENT:     Head: Normocephalic.     Ears:     Comments: Hearing aid left ear canal Pulmonary:     Effort: Pulmonary effort is normal.  Abdominal:     General: There is no distension.  Musculoskeletal:        General: Normal range of motion.  Neurological:     Mental Status: He is alert and oriented to person, place, and time.     ED Results / Procedures / Treatments   Labs (all labs ordered are listed, but only abnormal results are displayed) Labs Reviewed - No data to display  EKG None  Radiology No results found.  Procedures .Foreign Body Removal  Date/Time: 08/12/2023 8:29 PM  Performed by: Flint Sonny POUR, PA-C Authorized by: Flint Sonny POUR, PA-C  Consent: Verbal consent obtained. Consent given by: patient Patient understanding: patient states understanding of the procedure being performed Patient consent: the patient's understanding of the procedure matches consent given Patient identity confirmed: verbally with patient Time out: Immediately prior to procedure a time out was called to verify the correct patient, procedure, equipment, support staff and site/side marked as required. Body area: ear Location details: left ear Removal mechanism: alligator forceps Complexity: simple 1 objects recovered. Objects recovered: hearing aid Post-procedure assessment: foreign body removed      Medications Ordered in ED Medications - No data to display  ED Course/ Medical Decision Making/ A&P                                 Medical Decision Making Pt complains of hearing aid being stuck in his ear  Amount and/or Complexity of Data Reviewed Independent Historian: parent  Risk Risk Details: FB removed,             Final Clinical Impression(s) / ED Diagnoses Final diagnoses:  Foreign body of left ear, initial encounter    Rx / DC Orders ED Discharge Orders     None      An After Visit Summary was  printed and given to the patient.    Flint Sonny POUR, PA-C 08/12/23 2030    Cottie Donnice PARAS, MD 08/13/23 [phone]
